# Patient Record
Sex: Male | Born: 1970 | Race: White | Hispanic: No | Marital: Single | Smoking: Former smoker
Health system: Southern US, Community
[De-identification: ages and names within clinical notes are randomized; demographics above are authoritative.]

---

## 1995-04-17 HISTORY — PX: HAND SURGERY: SHX662

## 1997-11-01 ENCOUNTER — Ambulatory Visit (HOSPITAL_COMMUNITY): Admission: RE | Admit: 1997-11-01 | Discharge: 1997-11-01 | Payer: Self-pay | Admitting: Internal Medicine

## 1997-12-08 ENCOUNTER — Ambulatory Visit (HOSPITAL_COMMUNITY): Admission: RE | Admit: 1997-12-08 | Discharge: 1997-12-08 | Payer: Self-pay | Admitting: Internal Medicine

## 2001-02-20 ENCOUNTER — Emergency Department (HOSPITAL_COMMUNITY): Admission: EM | Admit: 2001-02-20 | Discharge: 2001-02-20 | Payer: Self-pay | Admitting: Emergency Medicine

## 2001-02-20 ENCOUNTER — Encounter: Payer: Self-pay | Admitting: Emergency Medicine

## 2001-06-25 ENCOUNTER — Emergency Department (HOSPITAL_COMMUNITY): Admission: EM | Admit: 2001-06-25 | Discharge: 2001-06-25 | Payer: Self-pay | Admitting: Emergency Medicine

## 2001-06-25 ENCOUNTER — Encounter: Payer: Self-pay | Admitting: Emergency Medicine

## 2003-06-20 ENCOUNTER — Emergency Department (HOSPITAL_COMMUNITY): Admission: EM | Admit: 2003-06-20 | Discharge: 2003-06-20 | Payer: Self-pay | Admitting: Emergency Medicine

## 2016-01-25 ENCOUNTER — Encounter: Payer: Self-pay | Admitting: Diagnostic Neuroimaging

## 2016-01-25 ENCOUNTER — Ambulatory Visit (INDEPENDENT_AMBULATORY_CARE_PROVIDER_SITE_OTHER): Payer: Self-pay | Admitting: Diagnostic Neuroimaging

## 2016-01-25 VITALS — BP 139/90 | HR 100 | Ht 68.0 in | Wt 163.8 lb

## 2016-01-25 DIAGNOSIS — G4489 Other headache syndrome: Secondary | ICD-10-CM

## 2016-01-25 DIAGNOSIS — M542 Cervicalgia: Secondary | ICD-10-CM
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
## 2016-01-25 NOTE — Patient Instructions (Signed)
Thank you for coming to see [place] at [facility]. I hope we have been able to provide you high quality care today.  You may receive a patient satisfaction survey over the next few weeks. We would appreciate your feedback and comments so that we may continue to improve ourselves and the health of our patients.  - I will setup physical therapy  - Slightly reduce physical activities and exercise  - Use ice therapy   ~~~~~~~~~~~~~~~~~~~~~~~~~~~~~~~~~~~~~~~~~~~~~~~~~~~~~~~~~~~~~~~~~  DR. Amya Hlad'S GUIDE TO HAPPY AND HEALTHY LIVING These are some of my general health and wellness recommendations. Some of them may apply to you better than others. Please use common sense as you try these suggestions and feel free to ask me any questions.   ACTIVITY/FITNESS Mental, social, emotional and physical stimulation are very important for brain and body health. Try learning a new activity (arts, music, language, sports, games).  Keep moving your body to the best of your abilities. You can do this at home, inside or outside, the park, community center, gym or anywhere you like. Consider a physical therapist or personal trainer to get started. Consider the app Sworkit. Fitness trackers such as smart-watches, smart-phones or Fitbits can help as well.   NUTRITION Eat more plants: colorful vegetables, nuts, seeds and berries.  Eat less sugar, salt, preservatives and processed foods.  Avoid toxins such as cigarettes and alcohol.  Drink water when you are thirsty. Warm water with a slice of lemon is an excellent morning drink to start the day.  Consider these websites for more information The Nutrition Source (https://www.henry-hernandez.biz/) Precision Nutrition (WindowBlog.ch)   RELAXATION Consider practicing mindfulness meditation or other relaxation techniques such as deep breathing, prayer, yoga, tai chi, massage. See website mindful.org  or the apps Headspace or Calm to help get started.   SLEEP Try to get at least 7-8+ hours sleep per day. Regular exercise and reduced caffeine will help you sleep better. Practice good sleep hygeine techniques. See website sleep.org for more information.   PLANNING Prepare estate planning, living will, healthcare POA documents. Sometimes this is best planned with the help of an attorney. Theconversationproject.org and agingwithdignity.org are excellent resources.

## 2016-01-30 ENCOUNTER — Encounter: Payer: Self-pay | Admitting: Rehabilitation

## 2016-01-30 ENCOUNTER — Ambulatory Visit: Payer: Self-pay | Attending: Diagnostic Neuroimaging | Admitting: Rehabilitation

## 2016-01-30 DIAGNOSIS — M542 Cervicalgia: Secondary | ICD-10-CM | POA: Insufficient documentation

## 2016-01-30 DIAGNOSIS — R293 Abnormal posture: Secondary | ICD-10-CM | POA: Insufficient documentation
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
## 2016-01-30 NOTE — Patient Instructions (Signed)
    Lower Abdominal Exercises 2A  Lie on your back with your knees bent. Rest one foot on the table and hold your other leg with your arms at 90 degrees of hip flexion.  Pull your stomach muscles tight to prevent your low back from arching and march one foot.  Return that foot to the table, repete with the opposite foot.  Axial Extension (Chin Tuck)    Do this lying down.  Pull chin in and lengthen back of neck. Hold __5__ seconds while counting out loud. Repeat _10___ times. Do __2__ sessions per day.  http://gt2.exer.us/449   Copyright  VHI. All rights reserved.   Deep Squat    Squat and lift with both arms held against upper trunk. Tighten stomach muscles without holding breath. Use smooth movements to avoid jerking.  Copyright  VHI. All rights reserved.

## 2016-02-15 ENCOUNTER — Ambulatory Visit: Payer: Self-pay | Attending: Diagnostic Neuroimaging | Admitting: Physical Therapy

## 2016-02-15 ENCOUNTER — Encounter: Payer: Self-pay | Admitting: Physical Therapy

## 2016-02-15 DIAGNOSIS — R293 Abnormal posture: Secondary | ICD-10-CM | POA: Insufficient documentation

## 2016-02-15 DIAGNOSIS — M542 Cervicalgia: Secondary | ICD-10-CM | POA: Insufficient documentation

## 2016-02-16 NOTE — Therapy (Signed)
[facility] [address] [place], [place], [address] Phone: [phone]   Fax:  [phone]  Physical Therapy Treatment  Patient Details  Name: Brendan Holmes MRN: [id number] Date of Birth: 1970/09/06 Referring Provider: Andrey Spearman, MD  Encounter Date: 02/15/2016   02/15/16 1109  PT Visits / Re-Eval  Visit Number 2  Number of Visits 9  Date for PT Re-Evaluation 03/16/16 (date adjusted 4wks from 1st visit due to pt scheduling needs)  Authorization  Authorization Type self pay  PT Time Calculation  PT Start Time 1105  PT Stop Time 1145  PT Time Calculation (min) 40 min  PT - End of Session  Activity Tolerance Patient limited by pain;Patient tolerated treatment well  Behavior During Therapy [facility] for tasks assessed/performed     History reviewed. No pertinent past medical history.  Past Surgical History:  Procedure Laterality Date  . HAND SURGERY Right 1997   Pins placed    There were no vitals filed for this visit.    02/15/16 1106  Symptoms/Limitations  Subjective No new complaints. Describes it as more of a pressure now vs pain, with some dizzy spells. Has been doing chin tucks with no notable difference.   Limitations Walking;Standing  Patient Stated Goals "I want this thing on my neck to go away."    Pain Assessment  Currently in Pain? Yes  Pain Score 5  Pain Location Neck  Pain Orientation Right  Pain Descriptors / Indicators Sore;Pressure  Pain Type Chronic pain  Pain Onset More than a month ago  Pain Frequency Constant  Aggravating Factors  looking down. lying down flat  Pain Relieving Factors rest,          [facility] Adult PT Treatment/Exercise - 02/15/16 2329      Posture/Postural Control   Posture/Postural Control Postural limitations   Postural Limitations Rounded Shoulders;Forward head   Posture Comments Note pt with muscle tension in R paraspinous muscles (note he has muscle  tear), also has multiple trigger points in B upper traps.  PT evaluation very limited due to pt very tender to palpation therefore unable to fully assess cervical spine mobility/immobility and possible facet joint dysfunction.  Will assess as able.      Exercises   Other Exercises  supine: cervical nods and shakes x 10 reps each in pain free ranges only; seated edge of mat: chin tucks x 10 reps, posterior shoulder rolls x 10 reps.      Modalities   Modalities Ultrasound     Ultrasound   Ultrasound Location cervical paraspinal muscle/upper portion of upper trap on right side   Ultrasound Parameters 50% pulsed, 1.5 w/cm2, 1 mhz x 8 minutes   Ultrasound Goals Pain     Manual Therapy   Manual Therapy Soft tissue mobilization;Myofascial release;Manual Traction   Manual therapy comments for decreased pain and muscle tightness.    Soft tissue mobilization to right cervical paraspinals, upper traps, scalenes and STM in both seated and supine positions.   Myofascial Release along right cervical paraspinals, STM, scalenes and upper trap. performed both in propped seated postition and in supine   Manual Traction in hook lying position: suboccipital release and manual cervcial distraction performed. passive stretching to upper trap, scalenes and STM concurrent with gentle cervical distraction.  PT Short Term Goals - 01/30/16 1650      PT SHORT TERM GOAL #1   Title =LTG's           PT Long Term Goals - 01/30/16 1911      PT LONG TERM GOAL #1   Title Pt will be independent with cervical and postural HEP in order to indicate improved functional mobility and decreased pain.  (Target Date: 03/16/16)   Time 4   Period Weeks   Status New     PT LONG TERM GOAL #2   Title Pt will report no more than 3/10 pain with all functional mobility in order to indicate pain is decreased limiting factor.     Time 4   Period Weeks   Status New     PT LONG TERM  GOAL #3   Title Will assess NDI and improve by 20% in order to indicate improved self reported pain with functional mobility.     Time 4   Period Weeks   Status New     PT LONG TERM GOAL #4   Title Pt will improve cervical extension to 30 deg in order to indicate improved ability to perform work duties.     Time 4   Period Weeks   Status New     PT LONG TERM GOAL #5   Title Pt will improve cervical flexion to 40 deg in order to indicate improved ability to perform work duties.     Time 4   Period Weeks   Status New        02/15/16 1110  Plan  Clinical Impression Statement Today's skilled session focused on pain reduction through modalities and manual therapy. Pt did report a slight decrease in pressure/pain after session. Pt also completed the NDI with score of 50% impaired. Increased tolerance to manual therapy/touch today vs on eval. Pt is making progress toward goals and should benefit from continued PT to progress toward unmet goals.  Pt will benefit from skilled therapeutic intervention in order to improve on the following deficits Decreased mobility;Decreased range of motion;Decreased strength;Increased muscle spasms;Improper body mechanics;Postural dysfunction;Pain  Rehab Potential Good  Clinical Impairments Affecting Rehab Potential unsure of grade of muscle tear  PT Frequency 2x / week  PT Duration 4 weeks (note date adjusted from 1st scheduled visit due to scheduling conflict)  PT Treatment/Interventions ADLs/Self Care Home Management;Electrical Stimulation;Moist Heat;Ultrasound;Neuromuscular re-education;Patient/family education;Manual techniques;Taping  PT Next Visit Plan continue to address pain/pressure via modalities as needed (check if ultrasound helped), manual therapy for decreased muscle tightness/increased cervical motions  Consulted and Agree with Plan of Care Patient        Patient will benefit from skilled therapeutic intervention in order to improve the  following deficits and impairments:  Decreased mobility, Decreased range of motion, Decreased strength, Increased muscle spasms, Improper body mechanics, Postural dysfunction, Pain  Visit Diagnosis: Cervicalgia  Abnormal posture     Problem List There are no active problems to display for this patient.   Willow Ora, PTA, [facility] [address], [place], [place] [address] [phone] 02/16/16, 11:49 PM   Name: Brendan Holmes MRN: [id number] Date of Birth: 1971/03/06

## 2016-02-17 ENCOUNTER — Ambulatory Visit: Payer: Self-pay | Admitting: Physical Therapy

## 2016-02-17 ENCOUNTER — Encounter: Payer: Self-pay | Admitting: Physical Therapy

## 2016-02-17 DIAGNOSIS — M542 Cervicalgia: Secondary | ICD-10-CM

## 2016-02-17 DIAGNOSIS — R293 Abnormal posture: Secondary | ICD-10-CM

## 2016-02-17 NOTE — Therapy (Signed)
[facility] [address] [place], [place], [address] Phone: [phone]   Fax:  [phone]  Physical Therapy Treatment  Patient Details  Name: Brendan Holmes MRN: [id number] Date of Birth: 1970-12-02 Referring Provider: Andrey Spearman, MD  Encounter Date: 02/17/2016      PT End of Session - 02/17/16 1152    Visit Number 3   Number of Visits 9   Date for PT Re-Evaluation 03/16/16  date adjusted 4wks from 1st visit due to pt scheduling needs   Authorization Type self pay   PT Start Time 1102   PT Stop Time 1145   PT Time Calculation (min) 43 min   Activity Tolerance Patient limited by pain;Patient tolerated treatment well   Behavior During Therapy [facility] for tasks assessed/performed      History reviewed. No pertinent past medical history.  Past Surgical History:  Procedure Laterality Date  . HAND SURGERY Right 1997   Pins placed    There were no vitals filed for this visit.      Subjective Assessment - 02/17/16 1110    Subjective Felt better after last session. Symptoms returned that night. Does report that some movements are getting easier like washing hair and looking around.    Limitations Walking;Standing   Patient Stated Goals "I want this thing on my neck to go away."     Currently in Pain? Yes   Pain Score 5    Pain Location Neck   Pain Orientation Right   Pain Descriptors / Indicators Sore;Pressure   Pain Type Acute pain   Pain Radiating Towards upwards into the head   Pain Onset More than a month ago   Pain Frequency Constant   Aggravating Factors  looking down, lying flat, raising arms   Pain Relieving Factors rest             [facility] Adult PT Treatment/Exercise - 02/17/16 1114      Exercises   Other Exercises  cervical isometrics for extension and bil lateral flexion, 5 sec holds x 10 reps each with cues on ex form and resistance against PTA hand     Modalities   Modalities  Ultrasound     Ultrasound   Ultrasound Location cervical paraspinal muscle/upper portion of upper trap on right side   Ultrasound Parameters 100%, 1 mhz, 1.0 w/cm2 x 10 minutes   Ultrasound Goals Pain     Manual Therapy   Manual Therapy Soft tissue mobilization;Myofascial release;Manual Traction   Manual therapy comments for decreased pain and muscle tightness.    Soft tissue mobilization to right cervical paraspinals, upper traps, scalenes and STM in both seated and supine positions.   Myofascial Release along right cervical paraspinals, STM, scalenes and upper trap. performed both in propped seated postition and in supine   Manual Traction in hook lying position: suboccipital release and manual cervcial distraction performed. passive stretching to upper trap, scalenes and STM concurrent with gentle cervical distraction.                                          PT Short Term Goals - 01/30/16 1650      PT SHORT TERM GOAL #1   Title =LTG's           PT Long Term Goals - 01/30/16 1911      PT LONG TERM GOAL #1  Title Pt will be independent with cervical and postural HEP in order to indicate improved functional mobility and decreased pain.  (Target Date: 03/16/16)   Time 4   Period Weeks   Status New     PT LONG TERM GOAL #2   Title Pt will report no more than 3/10 pain with all functional mobility in order to indicate pain is decreased limiting factor.     Time 4   Period Weeks   Status New     PT LONG TERM GOAL #3   Title Will assess NDI and improve by 20% in order to indicate improved self reported pain with functional mobility.     Time 4   Period Weeks   Status New     PT LONG TERM GOAL #4   Title Pt will improve cervical extension to 30 deg in order to indicate improved ability to perform work duties.     Time 4   Period Weeks   Status New     PT LONG TERM GOAL #5   Title Pt will improve cervical flexion to 40 deg in order to indicate improved ability to  perform work duties.     Time 4   Period Weeks   Status New            Plan - 02/17/16 1152    Clinical Impression Statement Today's skilled session continued to focus on decreased pain and increased cervical motions. Pt reported feeling decreased symptoms at end of session (decreased pain/pressure), including in facial muscles. Pt is making steady progress toward goals and should benefit from continued PT to progress toward unmet goals.                                                Rehab Potential Good   Clinical Impairments Affecting Rehab Potential unsure of grade of muscle tear   PT Frequency 2x / week   PT Duration 4 weeks  note date adjusted from 1st scheduled visit due to scheduling conflict   PT Treatment/Interventions ADLs/Self Care Home Management;Electrical Stimulation;Moist Heat;Ultrasound;Neuromuscular re-education;Patient/family education;Manual techniques;Taping   PT Next Visit Plan continue to address pain/pressure via modalities as needed, manual therapy for decreased muscle tightness/increased cervical motions   Consulted and Agree with Plan of Care Patient      Patient will benefit from skilled therapeutic intervention in order to improve the following deficits and impairments:  Decreased mobility, Decreased range of motion, Decreased strength, Increased muscle spasms, Improper body mechanics, Postural dysfunction, Pain  Visit Diagnosis: Cervicalgia  Abnormal posture     Problem List There are no active problems to display for this patient.   Willow Ora, PTA, [facility] [address] [place], [place] [address] [phone] 02/17/16, 11:55 AM   Name: Brendan Holmes MRN: [id number] Date of Birth: October 08, 1970

## 2016-02-20 ENCOUNTER — Ambulatory Visit: Payer: Self-pay | Admitting: Physical Therapy

## 2016-02-20 ENCOUNTER — Encounter: Payer: Self-pay | Admitting: Physical Therapy

## 2016-02-20 DIAGNOSIS — R293 Abnormal posture: Secondary | ICD-10-CM

## 2016-02-20 DIAGNOSIS — M542 Cervicalgia: Secondary | ICD-10-CM

## 2016-02-20 NOTE — Patient Instructions (Addendum)
Extension: Isometric, Ball   Stand with hands locked behind your head.  Tuck chin, pushing head into hands and hold 5 seconds. Repeat __10 times per set. Do _1_ sets per session. Do _1-2 sessions per week.  Copyright  VHI. All rights reserved.

## 2016-02-20 NOTE — Therapy (Signed)
[facility] [address] [place], [place], [address] Phone: [phone]   Fax:  [phone]  Physical Therapy Treatment  Patient Details  Name: Brendan Holmes MRN: [id number] Date of Birth: 1970/04/23 Referring Provider: Andrey Spearman, MD  Encounter Date: 02/20/2016      PT End of Session - 02/20/16 1520    Visit Number 4   Number of Visits 9   Date for PT Re-Evaluation 03/16/16  date adjusted 4wks from 1st visit due to pt scheduling needs   Authorization Type self pay   PT Start Time 1107   PT Stop Time 1145   PT Time Calculation (min) 38 min   Activity Tolerance Patient limited by pain;Patient tolerated treatment well   Behavior During Therapy [facility] for tasks assessed/performed      History reviewed. No pertinent past medical history.  Past Surgical History:  Procedure Laterality Date  . HAND SURGERY Right 1997   Pins placed    There were no vitals filed for this visit.      Subjective Assessment - 02/20/16 1107    Subjective No new complaints. Does feel like he is getting a little better with pain, still with the pressure that radiates up into his jaw/head with certain movements and palpation to sore area.   Limitations Walking;Standing   Patient Stated Goals "I want this thing on my neck to go away."     Currently in Pain? Yes   Pain Score 5    Pain Location Neck   Pain Orientation Right   Pain Descriptors / Indicators Aching;Pressure   Pain Type Acute pain   Pain Onset More than a month ago   Pain Frequency Constant   Aggravating Factors  looking down, pressure to sore area and raising arms   Pain Relieving Factors rest, heat             [facility] Adult PT Treatment/Exercise - 02/20/16 1515      Neck Exercises: Machines for Strengthening   UBE (Upper Arm Bike) level 2.5 x 2 minutes each fwd/bwd with cues on posture and neutral cervcial spine     Neck Exercises: Standing   Wall Push  Ups 10 reps;Limitations   Wall Push Ups Limitations with red pball on wall: cues on form and technique   Upper Extremity Flexion with Stabilization Flexion;10 reps;Limitations   UE Flexion with Stabilization Limitations rolling red pball up wall with flexion stretch at top, cues on neutral cervical position     Neck Exercises: Seated   Cervical Isometrics Extension;Right lateral flexion;Left lateral flexion;5 secs;10 reps;Limitations   Cervical Isometrics Limitations cues for correct form and technique with each one. added extension to HEP     Ultrasound   Ultrasound Location cervical paraspinal muscle/upper portion of upper trap on right side   Ultrasound Parameters 100%, 1 mhz, 1.0 w/cm2, x 10 minutes   Ultrasound Goals Pain     Manual Therapy   Manual Therapy Soft tissue mobilization   Manual therapy comments for decreased pain and muscle tightness.    Soft tissue mobilization to right cervical paraspinals, upper traps, scalenes and STM in both seated and supine positions.            PT Education - 02/20/16 1143    Education provided Yes   Education Details added seated cervical extension isometrics to HEP   Person(s) Educated Patient   Methods Explanation;Demonstration;Verbal cues;Handout   Comprehension Verbalized understanding;Returned demonstration;Need further instruction  PT Short Term Goals - 01/30/16 1650      PT SHORT TERM GOAL #1   Title =LTG's           PT Long Term Goals - 01/30/16 1911      PT LONG TERM GOAL #1   Title Pt will be independent with cervical and postural HEP in order to indicate improved functional mobility and decreased pain.  (Target Date: 03/16/16)   Time 4   Period Weeks   Status New     PT LONG TERM GOAL #2   Title Pt will report no more than 3/10 pain with all functional mobility in order to indicate pain is decreased limiting factor.     Time 4   Period Weeks   Status New     PT LONG TERM GOAL #3   Title Will  assess NDI and improve by 20% in order to indicate improved self reported pain with functional mobility.     Time 4   Period Weeks   Status New     PT LONG TERM GOAL #4   Title Pt will improve cervical extension to 30 deg in order to indicate improved ability to perform work duties.     Time 4   Period Weeks   Status New     PT LONG TERM GOAL #5   Title Pt will improve cervical flexion to 40 deg in order to indicate improved ability to perform work duties.     Time 4   Period Weeks   Status New             Plan - 02/20/16 1520    Clinical Impression Statement today's skilled session continued to address decreasing pain. Also advanced strengthening exercises today without any issues reported. Pt is making slow, steady progress toward goals and should benefit from continued PT to progress toward unmet goals.    Rehab Potential Good   Clinical Impairments Affecting Rehab Potential unsure of grade of muscle tear   PT Frequency 2x / week   PT Duration 4 weeks  note date adjusted from 1st scheduled visit due to scheduling conflict   PT Treatment/Interventions ADLs/Self Care Home Management;Electrical Stimulation;Moist Heat;Ultrasound;Neuromuscular re-education;Patient/family education;Manual techniques;Taping   PT Next Visit Plan continue to address pain/pressure via modalitie and manual therapy as needed, continue to work on cervical strengthening.    Consulted and Agree with Plan of Care Patient      Patient will benefit from skilled therapeutic intervention in order to improve the following deficits and impairments:  Decreased mobility, Decreased range of motion, Decreased strength, Increased muscle spasms, Improper body mechanics, Postural dysfunction, Pain  Visit Diagnosis: Cervicalgia  Abnormal posture     Problem List There are no active problems to display for this patient.   Willow Ora, PTA, [facility] [address] [place], [place] [address] [phone] 02/20/16, 3:25 PM   Name: Brendan Holmes MRN: [id number] Date of Birth: 1970/12/07

## 2016-02-24 ENCOUNTER — Encounter: Payer: Self-pay | Admitting: Rehabilitation

## 2016-02-24 ENCOUNTER — Ambulatory Visit: Payer: Self-pay | Admitting: Rehabilitation

## 2016-02-24 DIAGNOSIS — R293 Abnormal posture: Secondary | ICD-10-CM

## 2016-02-24 DIAGNOSIS — M542 Cervicalgia: Secondary | ICD-10-CM

## 2016-02-24 NOTE — Therapy (Signed)
[facility] [address] [place], [place], [address] Phone: [phone]   Fax:  [phone]  Physical Therapy Treatment  Patient Details  Name: Brendan Holmes MRN: [id number] Date of Birth: July 07, 1970 Referring Provider: Andrey Spearman, MD  Encounter Date: 02/24/2016      PT End of Session - 02/24/16 1950    Visit Number 5   Number of Visits 9   Date for PT Re-Evaluation 03/16/16  date adjusted 4wks from 1st visit due to pt scheduling needs   Authorization Type self pay   PT Start Time 1148   PT Stop Time 1232   PT Time Calculation (min) 44 min   Activity Tolerance Patient limited by pain;Patient tolerated treatment well   Behavior During Therapy [facility] for tasks assessed/performed      History reviewed. No pertinent past medical history.  Past Surgical History:  Procedure Laterality Date  . HAND SURGERY Right 1997   Pins placed    There were no vitals filed for this visit.      Subjective Assessment - 02/24/16 1155    Currently in Pain? Yes   Pain Score 5    Pain Location Neck   Pain Orientation Right   Pain Descriptors / Indicators Pressure   Pain Type Acute pain   Pain Onset More than a month ago   Pain Frequency Constant                         [facility] Adult PT Treatment/Exercise - 02/24/16 0001      Self-Care   Self-Care Other Self-Care Comments   Other Self-Care Comments  Continue to provide demonstration and education on importance of posture throughout all mobility.  Also discussed muscle guarding as this is marked reason pt presents with increased tightness on B portions of cervical region.       Ultrasound   Ultrasound Location cervical paraspinals/upper trap on R side   Ultrasound Parameters 100%, 44mHz, 1.5w/cm2 x 10 mins.  Light pressure with ultrasound head to upper trap for soft tissue mobilization.     Ultrasound Goals Pain     Manual Therapy   Manual Therapy  Soft tissue mobilization   Manual therapy comments for decreased pain and muscle tightness.    Soft tissue mobilization to right cervical paraspinals, upper traps, and scalenes in supine position    Myofascial Release To R upper trap, R scalenes, L upper trap in seated and supine   Manual Traction Light manual traction as well as suboccipital release during session x 3 reps of 2 mins.  Provided education/demonstration on how to use tennis balls in sleeve to perform self sub occipital release.  Pt returned demonstration and states he would attempt at home for pain relief and decreased referred pain.                 PT Education - 02/24/16 1949    Education provided Yes   Education Details importance of posture during all mobility. Verbal addition of UE extension with band in doorway.     Person(s) Educated Patient   Methods Explanation;Demonstration   Comprehension Verbalized understanding;Returned demonstration          PT Short Term Goals - 01/30/16 1650      PT SHORT TERM GOAL #1   Title =LTG's           PT Long Term Goals - 01/30/16 1911      PT LONG TERM  GOAL #1   Title Pt will be independent with cervical and postural HEP in order to indicate improved functional mobility and decreased pain.  (Target Date: 03/16/16)   Time 4   Period Weeks   Status New     PT LONG TERM GOAL #2   Title Pt will report no more than 3/10 pain with all functional mobility in order to indicate pain is decreased limiting factor.     Time 4   Period Weeks   Status New     PT LONG TERM GOAL #3   Title Will assess NDI and improve by 20% in order to indicate improved self reported pain with functional mobility.     Time 4   Period Weeks   Status New     PT LONG TERM GOAL #4   Title Pt will improve cervical extension to 30 deg in order to indicate improved ability to perform work duties.     Time 4   Period Weeks   Status New     PT LONG TERM GOAL #5   Title Pt will improve  cervical flexion to 40 deg in order to indicate improved ability to perform work duties.     Time 4   Period Weeks   Status New               Plan - 02/24/16 1950    Clinical Impression Statement Skilled session continues to focus on decreasing cervical pain and improving flexibility in cervical region.  Pt continues to make slow progress towards goals.      Rehab Potential Good   Clinical Impairments Affecting Rehab Potential unsure of grade of muscle tear   PT Frequency 2x / week   PT Duration 4 weeks  note date adjusted from 1st scheduled visit due to scheduling conflict   PT Treatment/Interventions ADLs/Self Care Home Management;Electrical Stimulation;Moist Heat;Ultrasound;Neuromuscular re-education;Patient/family education;Manual techniques;Taping   PT Next Visit Plan continue to address pain/pressure via modalitie and manual therapy as needed, continue to work on cervical strengthening.    Consulted and Agree with Plan of Care Patient      Patient will benefit from skilled therapeutic intervention in order to improve the following deficits and impairments:  Decreased mobility, Decreased range of motion, Decreased strength, Increased muscle spasms, Improper body mechanics, Postural dysfunction, Pain  Visit Diagnosis: Cervicalgia  Abnormal posture     Problem List There are no active problems to display for this patient.   Cameron Sprang, PT, MPT [facility] [address] [place], [place], [address] Phone: [phone]   Fax:  [phone] 02/24/16, 7:52 PM  Name: Brendan Holmes MRN: [id number] Date of Birth: Oct 17, 1970

## 2016-02-27 ENCOUNTER — Ambulatory Visit: Payer: Self-pay | Admitting: Physical Therapy

## 2016-02-27 ENCOUNTER — Encounter: Payer: Self-pay | Admitting: Physical Therapy

## 2016-02-27 DIAGNOSIS — M542 Cervicalgia: Secondary | ICD-10-CM

## 2016-02-27 DIAGNOSIS — R293 Abnormal posture: Secondary | ICD-10-CM

## 2016-02-27 NOTE — Therapy (Cosign Needed)
[facility] [address] [place], [place], [address] Phone: [phone]   Fax:  [phone]  Physical Therapy Treatment  Patient Details  Name: Brendan Holmes MRN: [id number] Date of Birth: [birth year] Referring Provider: Andrey Spearman, MD  Encounter Date: 02/27/2016      PT End of Session - 02/27/16 1454    Visit Number 6   Number of Visits 9   Date for PT Re-Evaluation 03/16/16  date adjusted 4wks from 1st visit due to pt scheduling needs   Authorization Type self pay   PT Start Time 1230   PT Stop Time 1315   PT Time Calculation (min) 45 min   Activity Tolerance Patient limited by pain;Patient tolerated treatment well   Behavior During Therapy [facility] for tasks assessed/performed      History reviewed. No pertinent past medical history.  Past Surgical History:  Procedure Laterality Date  . HAND SURGERY Right 1997   Pins placed    There were no vitals filed for this visit.      Subjective Assessment - 02/27/16 1232    Subjective Patient reports that he feels some improvement with his pain this weekend but is still unable to sleep with a pillow at night due to the pain and pressure. He lays down with his head to the left and no pillow.   Limitations Walking;Standing   Patient Stated Goals "I want this thing on my neck to go away."     Pain Score 5    Pain Location Neck   Pain Orientation Right   Pain Descriptors / Indicators Pressure   Pain Type Acute pain   Pain Onset More than a month ago   Pain Frequency Constant   Aggravating Factors  Looking down, pressure to sore area and raising arms   Pain Relieving Factors rest, heat           [facility] Adult PT Treatment/Exercise - 02/27/16 1437      Neck Exercises: Theraband   Scapula Retraction 10 reps;Red  2x reps elblows flexed   Scapula Retraction Limitations Red theraband and handles attached to door handle. Verbal and tactile cues for correct  technique.   Rows 10 reps;Red;Limitations  x2 reps elbows extended.   Rows Limitations Red theraband anchored in top corner of door; scapular retraction and depression. Verbal and tactile cues for proper technique.     Neck Exercises: Standing   Wall Push Ups 10 reps;Limitations   Wall Push Ups Limitations with red pball on wall: cues on form and technique;     Ultrasound   Ultrasound Location cervical paraspinals/upper trap on R side   Ultrasound Parameters 100%, 29mHz, 1w/cm2 x 10 mins. Light pressure with ultrasound head to upper trap for soft tissue mobilization.    Ultrasound Goals Pain     Manual Therapy   Manual Therapy Soft tissue mobilization   Manual therapy comments for decreased pain and muscle tightness.    Soft tissue mobilization to right cervical paraspinals, upper traps, and scalenes in supine position    Manual Traction Light manual traction 3 reps with 40 second holds; as well as suboccipital release during session x 3 reps of 1 min.           PT Short Term Goals - 01/30/16 1650      PT SHORT TERM GOAL #1   Title =LTG's           PT Long Term Goals - 01/30/16 1911  PT LONG TERM GOAL #1   Title Pt will be independent with cervical and postural HEP in order to indicate improved functional mobility and decreased pain.  (Target Date: 03/16/16)   Time 4   Period Weeks   Status New     PT LONG TERM GOAL #2   Title Pt will report no more than 3/10 pain with all functional mobility in order to indicate pain is decreased limiting factor.     Time 4   Period Weeks   Status New     PT LONG TERM GOAL #3   Title Will assess NDI and improve by 20% in order to indicate improved self reported pain with functional mobility.     Time 4   Period Weeks   Status New     PT LONG TERM GOAL #4   Title Pt will improve cervical extension to 30 deg in order to indicate improved ability to perform work duties.     Time 4   Period Weeks   Status New     PT LONG  TERM GOAL #5   Title Pt will improve cervical flexion to 40 deg in order to indicate improved ability to perform work duties.     Time 4   Period Weeks   Status New           Plan - 02/27/16 1454    Clinical Impression Statement Todays skilled session focused on reducing cervical pain and strengthening postural muscles to relieve stress on the cervical region. Scapular retraction with elbows bent seemed to be the most effective activity at activating scapular stabilizers. Pt is progressing slowly towards goals and should benefit from continued PT to achieve goals.   Rehab Potential Good   Clinical Impairments Affecting Rehab Potential unsure of grade of muscle tear   PT Frequency 2x / week   PT Duration 4 weeks  note date adjusted from 1st scheduled visit due to scheduling conflict   PT Treatment/Interventions ADLs/Self Care Home Management;Electrical Stimulation;Moist Heat;Ultrasound;Neuromuscular re-education;Patient/family education;Manual techniques;Taping   PT Next Visit Plan continue to address pain/pressure via modalities and manual therapy as needed, continue to work on cervical strengthening.    Consulted and Agree with Plan of Care Patient      Patient will benefit from skilled therapeutic intervention in order to improve the following deficits and impairments:  Decreased mobility, Decreased range of motion, Decreased strength, Increased muscle spasms, Improper body mechanics, Postural dysfunction, Pain  Visit Diagnosis: Cervicalgia  Abnormal posture     Problem List There are no active problems to display for this patient.   Benjiman Core, SPTA 02/27/2016, 3:56 PM  [facility] [address] [place], [place], [address] Phone: [phone]   Fax:  [phone]  Name: Brendan Holmes MRN: [id number] Date of Birth: 1970/08/27

## 2016-02-29 ENCOUNTER — Ambulatory Visit: Payer: Self-pay | Admitting: Physical Therapy

## 2016-03-05 ENCOUNTER — Ambulatory Visit: Payer: Self-pay | Admitting: Physical Therapy

## 2016-03-05 ENCOUNTER — Encounter: Payer: Self-pay | Admitting: Physical Therapy

## 2016-03-05 DIAGNOSIS — M542 Cervicalgia: Secondary | ICD-10-CM

## 2016-03-05 DIAGNOSIS — R293 Abnormal posture: Secondary | ICD-10-CM

## 2016-03-05 NOTE — Therapy (Signed)
[facility] [address] [place], [place], [address] Phone: [phone]   Fax:  [phone]  Physical Therapy Treatment  Patient Details  Name: Brendan Holmes MRN: [id number] Date of Birth: 1970/07/07 Referring Provider: Andrey Spearman, MD  Encounter Date: 03/05/2016      PT End of Session - 03/05/16 1324    Visit Number 7   Number of Visits 9   Date for PT Re-Evaluation 03/16/16  date adjusted 4wks from 1st visit due to pt scheduling needs   Authorization Type self pay   PT Start Time 1318   PT Stop Time 1359   PT Time Calculation (min) 41 min   Activity Tolerance Patient tolerated treatment well   Behavior During Therapy [facility] for tasks assessed/performed      History reviewed. No pertinent past medical history.  Past Surgical History:  Procedure Laterality Date  . HAND SURGERY Right 1997   Pins placed    There were no vitals filed for this visit.      Subjective Assessment - 03/05/16 1322    Subjective No new complaints. Feels the knot is getting smaller, still has pain when he looks down or in certain directions.    Limitations Walking;Standing   Patient Stated Goals "I want this thing on my neck to go away."     Currently in Pain? Yes   Pain Score 4    Pain Location Neck   Pain Orientation Right   Pain Descriptors / Indicators Pressure   Pain Type Acute pain   Pain Radiating Towards upwards into the head   Pain Onset More than a month ago   Pain Frequency Constant   Aggravating Factors  looking down, pressure to knot in neck, raising arms up   Pain Relieving Factors rest, heat            [facility] Adult PT Treatment/Exercise - 03/05/16 1326      Neck Exercises: Machines for Strengthening   UBE (Upper Arm Bike) level 2.5 x 2 minutes each fwd/bwd with cues on posture and neutral cervcial spine     Neck Exercises: Theraband   Scapula Retraction 10 reps;Green;Limitations   Scapula  Retraction Limitations Green theraband and handles attached to door handle. Verbal and tactile cues for correct technique.   Rows 10 reps;Green   Rows Limitations Green theraband anchored in top corner of door; scapular retraction and depression. Verbal and tactile cues for proper technique.   Other Theraband Exercises shoulder abduction with green band x 10 reps each side, cues on form and technique     Neck Exercises: Standing   Wall Push Ups 10 reps;Limitations   Wall Push Ups Limitations with red pball on wall: cues on form and technique;   Upper Extremity Flexion with Stabilization Flexion;10 reps;Limitations   UE Flexion with Stabilization Limitations rolling red pball up wall with flexion stretch at top, cues on neutral cervical position     Ultrasound   Ultrasound Location cervical paraspinal/upper trap on right side   Ultrasound Parameters 100 %, 1 mhz, 1.2 w/cm2 x 8 minutes for decreased pain/increased tissue extensibility   Ultrasound Goals Pain     Manual Therapy   Manual Therapy Soft tissue mobilization;Myofascial release             PT Short Term Goals - 01/30/16 1650      PT SHORT TERM GOAL #1   Title =LTG's           PT Long Term  Goals - 01/30/16 1911      PT LONG TERM GOAL #1   Title Pt will be independent with cervical and postural HEP in order to indicate improved functional mobility and decreased pain.  (Target Date: 03/16/16)   Time 4   Period Weeks   Status New     PT LONG TERM GOAL #2   Title Pt will report no more than 3/10 pain with all functional mobility in order to indicate pain is decreased limiting factor.     Time 4   Period Weeks   Status New     PT LONG TERM GOAL #3   Title Will assess NDI and improve by 20% in order to indicate improved self reported pain with functional mobility.     Time 4   Period Weeks   Status New     PT LONG TERM GOAL #4   Title Pt will improve cervical extension to 30 deg in order to indicate improved  ability to perform work duties.     Time 4   Period Weeks   Status New     PT LONG TERM GOAL #5   Title Pt will improve cervical flexion to 40 deg in order to indicate improved ability to perform work duties.     Time 4   Period Weeks   Status New            Plan - 03/05/16 1325    Clinical Impression Statement today's skilled session continued to focus on reducing cervical pain and increasing cervical/postural strength. No issues reported with session. Pt is making steady progress toward goals and should benefit from continued PT to progress toward unmet goals.    Rehab Potential Good   Clinical Impairments Affecting Rehab Potential unsure of grade of muscle tear   PT Frequency 2x / week   PT Duration 4 weeks  note date adjusted from 1st scheduled visit due to scheduling conflict   PT Treatment/Interventions ADLs/Self Care Home Management;Electrical Stimulation;Moist Heat;Ultrasound;Neuromuscular re-education;Patient/family education;Manual techniques;Taping   PT Next Visit Plan continue to address pain/pressure via modalities and manual therapy as needed, continue to work on cervical strengthening.    Consulted and Agree with Plan of Care Patient      Patient will benefit from skilled therapeutic intervention in order to improve the following deficits and impairments:  Decreased mobility, Decreased range of motion, Decreased strength, Increased muscle spasms, Improper body mechanics, Postural dysfunction, Pain  Visit Diagnosis: Cervicalgia  Abnormal posture     Problem List There are no active problems to display for this patient.   Willow Ora, PTA, [facility] [address] [place], [place] [address] [phone] 03/05/16, 2:01 PM   Name: Brendan Holmes MRN: [id number] Date of Birth: Aug 26, 1970

## 2016-03-07 ENCOUNTER — Ambulatory Visit: Payer: Self-pay | Admitting: Physical Therapy

## 2016-03-07 ENCOUNTER — Encounter: Payer: Self-pay | Admitting: Physical Therapy

## 2016-03-07 DIAGNOSIS — M542 Cervicalgia: Secondary | ICD-10-CM

## 2016-03-07 NOTE — Therapy (Signed)
[facility] [address] [place], [place], [address] Phone: [phone]   Fax:  [phone]  Physical Therapy Treatment  Patient Details  Name: Brendan Holmes MRN: [id number] Date of Birth: 1970-06-19 Referring Provider: Andrey Spearman, MD  Encounter Date: 03/07/2016      PT End of Session - 03/07/16 1150    Visit Number 8   Number of Visits 9   Date for PT Re-Evaluation 03/16/16  date adjusted 4wks from 1st visit due to pt scheduling needs   Authorization Type self pay   PT Start Time 1100   PT Stop Time 1145   PT Time Calculation (min) 45 min   Activity Tolerance Patient tolerated treatment well   Behavior During Therapy [facility] for tasks assessed/performed      History reviewed. No pertinent past medical history.  Past Surgical History:  Procedure Laterality Date  . HAND SURGERY Right 1997   Pins placed    There were no vitals filed for this visit.      Subjective Assessment - 03/07/16 1104    Subjective Doesn't feel like its getting better with exertion- pushing grocery shopping cart, gets a vertigo type feeling with bumping sensation.   Limitations Walking;Standing   Patient Stated Goals "I want this thing on my neck to go away."     Pain Onset More than a month ago                         [facility] Adult PT Treatment/Exercise - 03/07/16 0001      Neck Exercises: Machines for Strengthening   UBE (Upper Arm Bike) level 2.5 x 2 minutes each fwd/bwd with cues on posture and neutral cervcial spine   Cybex Row 10x2 cues for posture/ scapula retraction, 25#   Power Tower standing "Chop" motion 15# each side, emphasizing lat activiation and scapula stability                             Ultrasound   Ultrasound Location cervical paraspinal/upper trap on right side.   Ultrasound Parameters 100%, 1 mhz, 1.2 w/cm x 8 min for decreased pain/ tissue extensibility   Ultrasound Goals Pain     Manual Therapy   Manual Therapy Soft tissue mobilization;Myofascial release   Manual therapy comments for decreased pain and muscle tightness.    Soft tissue mobilization to right cervical paraspinals, upper traps, and scalenes in supine position  Reported increased discomfort with pressure directly on "muscle knot"                PT Education - 03/07/16 1149    Education provided Yes   Education Details Discussed scheduling for last visit for LTG check.   Person(s) Educated Patient   Methods Explanation;Demonstration   Comprehension Verbalized understanding;Returned demonstration          PT Short Term Goals - 01/30/16 1650      PT SHORT TERM GOAL #1   Title =LTG's           PT Long Term Goals - 01/30/16 1911      PT LONG TERM GOAL #1   Title Pt will be independent with cervical and postural HEP in order to indicate improved functional mobility and decreased pain.  (Target Date: 03/16/16)   Time 4   Period Weeks   Status New     PT LONG TERM GOAL #2   Title  Pt will report no more than 3/10 pain with all functional mobility in order to indicate pain is decreased limiting factor.     Time 4   Period Weeks   Status New     PT LONG TERM GOAL #3   Title Will assess NDI and improve by 20% in order to indicate improved self reported pain with functional mobility.     Time 4   Period Weeks   Status New     PT LONG TERM GOAL #4   Title Pt will improve cervical extension to 30 deg in order to indicate improved ability to perform work duties.     Time 4   Period Weeks   Status New     PT LONG TERM GOAL #5   Title Pt will improve cervical flexion to 40 deg in order to indicate improved ability to perform work duties.     Time 4   Period Weeks   Status New               Plan - 03/07/16 1151    Clinical Impression Statement Pt reports continued discomort/pressure/pain during daily activities that there is a "bumping sensation" like using the grocery  cart or going on some of his work equipment. Also has continued neck pain with exertion or sitting on certain chairs.  Pt would like to continue PT to treat these issues.             Rehab Potential Good   Clinical Impairments Affecting Rehab Potential unsure of grade of muscle tear   PT Frequency 2x / week   PT Duration 4 weeks  note date adjusted from 1st scheduled visit due to scheduling conflict   PT Treatment/Interventions ADLs/Self Care Home Management;Electrical Stimulation;Moist Heat;Ultrasound;Neuromuscular re-education;Patient/family education;Manual techniques;Taping   PT Next Visit Plan Check goals, renew?, refer to orthopedic PT?   Consulted and Agree with Plan of Care Patient      Patient will benefit from skilled therapeutic intervention in order to improve the following deficits and impairments:  Decreased mobility, Decreased range of motion, Decreased strength, Increased muscle spasms, Improper body mechanics, Postural dysfunction, Pain  Visit Diagnosis: Cervicalgia     Problem List There are no active problems to display for this patient.   Bjorn Loser 03/07/2016, 12:02 PM  [facility] [address] [place], [place], [address] Phone: [phone]   Fax:  [phone]  Name: Brendan Holmes MRN: [id number] Date of Birth: [birth year]

## 2016-03-16 ENCOUNTER — Ambulatory Visit: Payer: Self-pay | Attending: Diagnostic Neuroimaging | Admitting: Rehabilitation

## 2016-03-16 ENCOUNTER — Encounter: Payer: Self-pay | Admitting: Rehabilitation

## 2016-03-16 DIAGNOSIS — M542 Cervicalgia: Secondary | ICD-10-CM | POA: Insufficient documentation

## 2016-03-16 DIAGNOSIS — R293 Abnormal posture: Secondary | ICD-10-CM | POA: Insufficient documentation

## 2016-03-16 NOTE — Therapy (Signed)
[facility] [address] [place], [place], [address] Phone: [phone]   Fax:  [phone]  Physical Therapy Treatment  Patient Details  Name: Brendan Holmes MRN: [id number] Date of Birth: [birth year] Referring Provider: Andrey Spearman, MD  Encounter Date: 03/16/2016      PT End of Session - 03/16/16 0935    Visit Number 9   Number of Visits 17  updated POC   Date for PT Re-Evaluation 04/15/16  Per updated POC   Authorization Type self pay   PT Start Time 0930  D/C visit, did not need full time   PT Stop Time 1005   PT Time Calculation (min) 35 min   Activity Tolerance Patient tolerated treatment well   Behavior During Therapy [name] for tasks assessed/performed      History reviewed. No pertinent past medical history.  Past Surgical History:  Procedure Laterality Date  . HAND SURGERY Right 1997   Pins placed    There were no vitals filed for this visit.      Subjective Assessment - 03/16/16 1023    Subjective "I feel like it is getting a little bit better but its still there most of the time and I feel the fullness when I push on it or look up/down."    Limitations Walking;Standing   Patient Stated Goals "I want this thing on my neck to go away."     Currently in Pain? Yes   Pain Score 4    Pain Location Neck   Pain Orientation Right   Pain Descriptors / Indicators Pressure   Pain Type Acute pain   Pain Onset More than a month ago   Pain Frequency Constant   Aggravating Factors  looking down, looking up, pressure to knot in neck   Pain Relieving Factors rest, heat                         [facility] Adult PT Treatment/Exercise - 03/16/16 0001      Self-Care   Self-Care Other Self-Care Comments   Other Self-Care Comments  Had lengthy discussion with pt regarding continued deficits, progress, referred pain patterns, transferring care to OP at [name] for more specialized  orthopedic care and possible dry needling for muscle tension.  Pt verbalized understanding and PT answered all questions to best of ability following                 PT Education - 03/16/16 1139    Education provided Yes   Education Details see self care section   Person(s) Educated Patient   Methods Explanation   Comprehension Verbalized understanding          PT Short Term Goals - 01/30/16 1650      PT SHORT TERM GOAL #1   Title =LTG's           PT Long Term Goals - 03/16/16 0946      PT LONG TERM GOAL #1   Title Pt will be independent with cervical and postural HEP in order to indicate improved functional mobility and decreased pain.  (Updated Target Date: 04/12/16)   Baseline met 03/16/16   Time 4   Period Weeks   Status On-going     PT LONG TERM GOAL #2   Title Pt will report no more than 3/10 pain with all functional mobility in order to indicate pain is decreased limiting factor.  (Updated Date: 04/13/16)   Baseline reports  average 4/10 on a daily basis 03/16/16   Time 4   Period Weeks   Status On-going     PT LONG TERM GOAL #3   Title Will assess NDI and improve by 20% in order to indicate improved self reported pain with functional mobility.  (Updated Target Date: 04/12/16)   Baseline 46% on 03/16/16   Time 4   Period Weeks   Status Revised     PT LONG TERM GOAL #4   Title Pt will improve cervical extension to 30 deg in order to indicate improved ability to perform work duties.     Baseline 30 deg ext on 03/16/16   Time 4   Period Weeks   Status Achieved     PT LONG TERM GOAL #5   Title Pt will improve cervical flexion to 40 deg in order to indicate improved ability to perform work duties.     Baseline 50 deg flex on 03/16/16   Time 4   Period Weeks   Status Achieved     Additional Long Term Goals   Additional Long Term Goals Yes     PT LONG TERM GOAL #6   Title Pt will increase R cervical rotation to 60 deg and L cervical rotation to 50  deg in order to indicate improved safety with driving.  (Target Date: 04/12/16)   Time 4   Period Weeks   Status New               Plan - 03/16/16 0936    Clinical Impression Statement Skilled session focused on assessment of LTGs and discussion of transferring pt to OP at [name] for more specialized orthopedic treatment and varying modalities (possible dry needling).  Pt has met goals, however is still limited greatly by pain with head motion, esp related to work activities and driving.  Feel that he would benefit from continued therapy to improve pain and increase functional mobility and job activities.  Pt verbalized understanding and agreement.     Rehab Potential Good   Clinical Impairments Affecting Rehab Potential unsure of grade of muscle tear   PT Frequency 2x / week   PT Duration 4 weeks   PT Treatment/Interventions ADLs/Self Care Home Management;Electrical Stimulation;Moist Heat;Ultrasound;Neuromuscular re-education;Patient/family education;Manual techniques;Taping   PT Next Visit Plan [name] re-certed for 4 more weeks for OP at [facility], also set new goals, modify these as you see fit! (unsure if he would benefit from dry needling?)   Consulted and Agree with Plan of Care Patient      Patient will benefit from skilled therapeutic intervention in order to improve the following deficits and impairments:  Decreased mobility, Decreased range of motion, Decreased strength, Increased muscle spasms, Improper body mechanics, Postural dysfunction, Pain  Visit Diagnosis: Cervicalgia  Abnormal posture     Problem List There are no active problems to display for this patient.   Denice Bors 03/16/2016, 11:48 AM  [facility] [address] [place], [place], [address] Phone: [phone]   Fax:  [phone]  Name: Brendan Holmes MRN: [id number] Date of Birth: June 04, 1970

## 2016-03-16 NOTE — Addendum Note (Signed)
Addended by: Cameron Sprang A on: 03/16/2016 11:51 AM   Modules accepted: Orders

## 2016-03-21 ENCOUNTER — Telehealth: Payer: Self-pay | Admitting: *Deleted

## 2016-03-21 NOTE — Telephone Encounter (Signed)
Attempted to reach patient re: reschedule FU due to dr out of office; phone was picked up, no one spoke then call was ended. Will try to reach later.

## 2016-03-22 NOTE — Telephone Encounter (Signed)
LVM requesting patient  call and reschedule his FU appointment due to provider being out of the office. Left number and advised the phone staff may reschedule for him.

## 2016-03-23 ENCOUNTER — Encounter: Payer: Self-pay | Admitting: *Deleted

## 2016-03-23 NOTE — Telephone Encounter (Signed)
Follow up on 04/27/16 cancelled. Letter mailed to patient requesting he call to reschedule appointment due to provider being out of the office.

## 2016-03-27 ENCOUNTER — Ambulatory Visit: Payer: Self-pay | Admitting: Physical Therapy

## 2016-03-27 DIAGNOSIS — R293 Abnormal posture: Secondary | ICD-10-CM

## 2016-03-27 DIAGNOSIS — M542 Cervicalgia: Secondary | ICD-10-CM

## 2016-03-27 NOTE — Therapy (Signed)
[facility] [place], [place], [address] Phone: [phone]   Fax:  [phone]  Physical Therapy Treatment  Patient Details  Name: Brendan Holmes MRN: [id number] Date of Birth: 04-07-1971 Referring Provider: Andrey Spearman, MD  Encounter Date: 03/27/2016      PT End of Session - 03/27/16 1512    Visit Number 10   Number of Visits 17   Date for PT Re-Evaluation 04/15/16   Authorization Type self pay   PT Start Time 0923   PT Stop Time 1232   PT Time Calculation (min) 47 min   Activity Tolerance Patient tolerated treatment well   Behavior During Therapy [facility] for tasks assessed/performed      No past medical history on file.  Past Surgical History:  Procedure Laterality Date  . HAND SURGERY Right 1997   Pins placed    There were no vitals filed for this visit.      Subjective Assessment - 03/27/16 1502    Subjective Patient reports over the weekend he was able to drive to [place] and had very little pain. He continues to have pain when looking down at his phone.    Limitations Walking;Standing   How long can you sit comfortably? No limit    Patient Stated Goals "I want this thing on my neck to go away."     Currently in Pain? Yes   Pain Score 4    Pain Location Neck   Pain Orientation Right   Pain Descriptors / Indicators Pressure   Pain Type Acute pain   Pain Radiating Towards up into the back of the head    Pain Onset More than a month ago   Aggravating Factors  looking down, looking up    Pain Relieving Factors rest, heat    Multiple Pain Sites No                         [facility] Adult PT Treatment/Exercise - 03/27/16 0001      Neck Exercises: Supine   Other Supine Exercise supine ER red, supine horrizontal abdcution red 2x10; d2 flexion 2x10 each arm      Manual Therapy   Manual Therapy Soft tissue mobilization;Myofascial release   Manual therapy comments for  decreased pain and muscle tightness. IASTMY to right upper tram and right cervical praspinals in order to facilitate healing and break up any dscar tissue that has formed.                 PT Education - 03/27/16 1512    Education Details Use of IASTYM, functional strengthening exercises    Person(s) Educated Patient   Methods Explanation   Comprehension Verbalized understanding          PT Short Term Goals - 01/30/16 1650      PT SHORT TERM GOAL #1   Title =LTG's           PT Long Term Goals - 03/27/16 1518      PT LONG TERM GOAL #1   Title Pt will be independent with cervical and postural HEP in order to indicate improved functional mobility and decreased pain.  (Updated Target Date: 04/12/16)   Baseline met 03/16/16   Time 4   Period Weeks   Status On-going     PT LONG TERM GOAL #2   Title Pt will report no more than 3/10 pain with all functional mobility in order to indicate pain  is decreased limiting factor.  (Updated Date: 04/13/16)   Baseline reports average 4/10 on a daily basis 03/16/16   Time 4   Period Weeks   Status On-going     PT LONG TERM GOAL #3   Title Will assess NDI and improve by 20% in order to indicate improved self reported pain with functional mobility.  (Updated Target Date: 04/12/16)   Baseline 46% on 03/16/16   Time 4   Period Weeks   Status On-going     PT LONG TERM GOAL #4   Title Pt will improve cervical extension to 30 deg in order to indicate improved ability to perform work duties.     Baseline 30 deg ext on 03/16/16   Time 4   Period Weeks   Status Achieved     PT LONG TERM GOAL #5   Title Pt will improve cervical flexion to 40 deg in order to indicate improved ability to perform work duties.     Baseline 50 deg flex on 03/16/16   Time 4   Period Weeks   Status Achieved     PT LONG TERM GOAL #6   Title Pt will increase R cervical rotation to 60 deg and L cervical rotation to 50 deg in order to indicate improved safety  with driving.  (Target Date: 04/12/16)   Time 4   Period Weeks   Status On-going               Plan - 03/27/16 1514    Clinical Impression Statement Patient tolerated treatment wll. He had improved pain with treatment. Therapy discussed needling with the paitnet but he may not be appropriate at this time 2nd to tear. If the patient does no progress we moy consider needling his peripheral muscles.    Rehab Potential Good   Clinical Impairments Affecting Rehab Potential unsure of grade of muscle tear   PT Frequency 2x / week   PT Duration 8 weeks   PT Treatment/Interventions ADLs/Self Care Home Management;Electrical Stimulation;Moist Heat;Ultrasound;Neuromuscular re-education;Patient/family education;Manual techniques;Taping   PT Next Visit Plan Continue with IASTYM, consider manual thrigger point release and sub occipital release, continue to work on extension with mulligan mobilizations. Continue with functional strengthening.    PT Home Exercise Plan supine D2 flexio: Supine ER red, supine Horizontal abduction    Consulted and Agree with Plan of Care Patient      Patient will benefit from skilled therapeutic intervention in order to improve the following deficits and impairments:  Decreased mobility, Decreased range of motion, Decreased strength, Increased muscle spasms, Improper body mechanics, Postural dysfunction, Pain  Visit Diagnosis: Cervicalgia  Abnormal posture     Problem List There are no active problems to display for this patient.   Carney Living PT DPT  03/27/2016, 3:25 PM  [facility] [address] [place], [place], [address] Phone: [phone]   Fax:  [phone]  Name: Brendan Holmes MRN: [id number] Date of Birth: Jan 01, 1971

## 2016-04-02 ENCOUNTER — Ambulatory Visit: Payer: Self-pay | Admitting: Physical Therapy

## 2016-04-04 ENCOUNTER — Ambulatory Visit: Payer: Self-pay | Admitting: Physical Therapy

## 2016-04-04 DIAGNOSIS — M542 Cervicalgia: Secondary | ICD-10-CM

## 2016-04-04 DIAGNOSIS — R293 Abnormal posture: Secondary | ICD-10-CM

## 2016-04-04 NOTE — Therapy (Signed)
[place], [place], [address] Phone: [phone]   Fax:  [phone]  Physical Therapy Treatment  Patient Details  Name: Brendan Holmes MRN: [id number] Date of Birth: 1971/02/26 Referring Provider: Andrey Spearman, MD  Encounter Date: 04/04/2016      PT End of Session - 04/04/16 1425    Visit Number 11   Number of Visits 17   Date for PT Re-Evaluation 04/15/16   Authorization Type self pay   PT Start Time 3354   PT Stop Time 1227   PT Time Calculation (min) 42 min   Activity Tolerance Patient tolerated treatment well   Behavior During Therapy [facility] for tasks assessed/performed      No past medical history on file.  Past Surgical History:  Procedure Laterality Date  . HAND SURGERY Right 1997   Pins placed    There were no vitals filed for this visit.      Subjective Assessment - 04/04/16 1421    Subjective Patient was sore after the last visit but it has been feeling better overall. He has been working. He has pain at times.    Limitations Walking;Standing   Patient Stated Goals "I want this thing on my neck to go away."     Currently in Pain? Yes   Pain Score 4    Pain Location Knee   Pain Orientation Right   Pain Descriptors / Indicators Pressure   Pain Type Acute pain   Pain Onset More than a month ago   Pain Frequency Constant   Aggravating Factors  looking down. looking    Pain Relieving Factors rest, heat    Multiple Pain Sites No                         [facility] Adult PT Treatment/Exercise - 04/04/16 0001      Neck Exercises: Standing   Other Standing Exercises standing shoulder flexion x10 3lb shoulder scaption 3lb 2x10; wall push ups 2x10      Neck Exercises: Supine   Other Supine Exercise supine ER green , supine horrizontal abdcution red 2x10; d2 flexion 2x10 each arm      Manual Therapy   Manual Therapy Soft tissue mobilization;Myofascial release   Manual therapy comments for decreased pain and muscle tightness. IASTMY to right upper tram and right cervical praspinals in order to facilitate healing and break up any dscar tissue that has formed.                 PT Education - 04/04/16 1424    Education provided Yes   Education Details improtance of strengthening    Person(s) Educated Patient   Methods Explanation   Comprehension Verbalized understanding;Returned demonstration;Verbal cues required;Tactile cues required          PT Short Term Goals - 01/30/16 1650      PT SHORT TERM GOAL #1   Title =LTG's           PT Long Term Goals - 03/27/16 1518      PT LONG TERM GOAL #1   Title Pt will be independent with cervical and postural HEP in order to indicate improved functional mobility and decreased pain.  (Updated Target Date: 04/12/16)   Baseline met 03/16/16   Time 4   Period Weeks   Status On-going     PT LONG TERM GOAL #2   Title Pt will report no more than 3/10 pain with  all functional mobility in order to indicate pain is decreased limiting factor.  (Updated Date: 04/13/16)   Baseline reports average 4/10 on a daily basis 03/16/16   Time 4   Period Weeks   Status On-going     PT LONG TERM GOAL #3   Title Will assess NDI and improve by 20% in order to indicate improved self reported pain with functional mobility.  (Updated Target Date: 04/12/16)   Baseline 46% on 03/16/16   Time 4   Period Weeks   Status On-going     PT LONG TERM GOAL #4   Title Pt will improve cervical extension to 30 deg in order to indicate improved ability to perform work duties.     Baseline 30 deg ext on 03/16/16   Time 4   Period Weeks   Status Achieved     PT LONG TERM GOAL #5   Title Pt will improve cervical flexion to 40 deg in order to indicate improved ability to perform work duties.     Baseline 50 deg flex on 03/16/16   Time 4   Period Weeks   Status Achieved     PT LONG TERM GOAL #6   Title Pt will increase R  cervical rotation to 60 deg and L cervical rotation to 50 deg in order to indicate improved safety with driving.  (Target Date: 04/12/16)   Time 4   Period Weeks   Status On-going               Plan - 04/04/16 1439    Clinical Impression Statement Patient is making good progress at this time. He was able to tolerate exercises well. He was able to perfrom exercises with a heavier band. Towrds the end he could feel some tension in the back of his head.    Rehab Potential Good   Clinical Impairments Affecting Rehab Potential unsure of grade of muscle tear   PT Frequency 2x / week   PT Duration 8 weeks   PT Treatment/Interventions ADLs/Self Care Home Management;Electrical Stimulation;Moist Heat;Ultrasound;Neuromuscular re-education;Patient/family education;Manual techniques;Taping   PT Next Visit Plan Continue with IASTYM, consider manual thrigger point release and sub occipital release, continue to work on extension with mulligan mobilizations. Continue with functional strengthening.    PT Home Exercise Plan supine D2 flexio: Supine ER red, supine Horizontal abduction    Consulted and Agree with Plan of Care Patient      Patient will benefit from skilled therapeutic intervention in order to improve the following deficits and impairments:  Decreased mobility, Decreased range of motion, Decreased strength, Increased muscle spasms, Improper body mechanics, Postural dysfunction, Pain  Visit Diagnosis: Cervicalgia  Abnormal posture     Problem List There are no active problems to display for this patient.   Carney Living PT DPT  04/04/2016, 2:41 PM  [facility] [address] [place], [place], [address] Phone: [phone]   Fax:  [phone]  Name: Brendan Holmes MRN: [id number] Date of Birth: 1970/10/18

## 2016-04-18 ENCOUNTER — Ambulatory Visit: Payer: Self-pay | Attending: Diagnostic Neuroimaging | Admitting: Physical Therapy

## 2016-04-18 DIAGNOSIS — M542 Cervicalgia: Secondary | ICD-10-CM

## 2016-04-18 DIAGNOSIS — R293 Abnormal posture: Secondary | ICD-10-CM

## 2016-04-19 NOTE — Therapy (Signed)
[place], [place], [address] Phone: [phone]   Fax:  [phone]  Physical Therapy Treatment  Patient Details  Name: Brendan Holmes MRN: [id number] Date of Birth: 1971/02/22 Referring Provider: Andrey Spearman, MD  Encounter Date: 04/18/2016      PT End of Session - 04/18/16 1603    Visit Number 12   Number of Visits 17   Date for PT Re-Evaluation 04/15/16   Authorization Type self pay   PT Start Time 1100   PT Stop Time 1141   PT Time Calculation (min) 41 min   Activity Tolerance Patient tolerated treatment well   Behavior During Therapy [facility] for tasks assessed/performed      No past medical history on file.  Past Surgical History:  Procedure Laterality Date  . HAND SURGERY Right 1997   Pins placed    There were no vitals filed for this visit.      Subjective Assessment - 04/18/16 1600    Subjective Patient reports the pain is becoming less frequent. On new years fday he was lifting a heavey well cover and he had a pain in hi neck. It hurt that night but was better the next day. He continues to have focal swelling on the lleft side of his cervical paraspinals but it seems improved from the lkast visit.    Limitations Walking;Standing   How long can you sit comfortably? No limit    Patient Stated Goals "I want this thing on my neck to go away."     Currently in Pain? No/denies                         [facility] Adult PT Treatment/Exercise - 04/19/16 0001      Neck Exercises: Standing   Other Standing Exercises standing shoulder flexion x10 3lb shoulder scaption 3lb 2x10; wall push ups 2x10      Neck Exercises: Supine   Other Supine Exercise seated ER blue  , seated horrizontal abdcution blue 2x10;      Manual Therapy   Manual Therapy Soft tissue mobilization;Myofascial release   Manual therapy comments for decreased pain and muscle tightness. IASTMY to right upper  tram and right cervical praspinals in order to facilitate healing and break up any dscar tissue that has formed.                 PT Education - 04/18/16 1602    Education provided Yes   Education Details improtance of stretghening, explained haling process    Person(s) Educated Patient   Methods Explanation   Comprehension Verbalized understanding;Returned demonstration;Verbal cues required          PT Short Term Goals - 01/30/16 1650      PT SHORT TERM GOAL #1   Title =LTG's           PT Long Term Goals - 03/27/16 1518      PT LONG TERM GOAL #1   Title Pt will be independent with cervical and postural HEP in order to indicate improved functional mobility and decreased pain.  (Updated Target Date: 04/12/16)   Baseline met 03/16/16   Time 4   Period Weeks   Status On-going     PT LONG TERM GOAL #2   Title Pt will report no more than 3/10 pain with all functional mobility in order to indicate pain is decreased limiting factor.  (Updated Date: 04/13/16)   Baseline reports average  4/10 on a daily basis 03/16/16   Time 4   Period Weeks   Status On-going     PT LONG TERM GOAL #3   Title Will assess NDI and improve by 20% in order to indicate improved self reported pain with functional mobility.  (Updated Target Date: 04/12/16)   Baseline 46% on 03/16/16   Time 4   Period Weeks   Status On-going     PT LONG TERM GOAL #4   Title Pt will improve cervical extension to 30 deg in order to indicate improved ability to perform work duties.     Baseline 30 deg ext on 03/16/16   Time 4   Period Weeks   Status Achieved     PT LONG TERM GOAL #5   Title Pt will improve cervical flexion to 40 deg in order to indicate improved ability to perform work duties.     Baseline 50 deg flex on 03/16/16   Time 4   Period Weeks   Status Achieved     PT LONG TERM GOAL #6   Title Pt will increase R cervical rotation to 60 deg and L cervical rotation to 50 deg in order to indicate  improved safety with driving.  (Target Date: 04/12/16)   Time 4   Period Weeks   Status On-going               Plan - 04/18/16 1603    Clinical Impression Statement Therapy advanced bands to blue band for ewxercises. He reported at one point a pull in his neck. After that he had some pain in his temple.    Rehab Potential Good   Clinical Impairments Affecting Rehab Potential unsure of grade of muscle tear   PT Treatment/Interventions ADLs/Self Care Home Management;Electrical Stimulation;Moist Heat;Ultrasound;Neuromuscular re-education;Patient/family education;Manual techniques;Taping   PT Next Visit Plan Continue with IASTYM, consider manual thrigger point release and sub occipital release, continue to work on extension with mulligan mobilizations. Continue with functional strengthening.    PT Home Exercise Plan supine D2 flexio: Supine ER red, supine Horizontal abduction    Consulted and Agree with Plan of Care Patient      Patient will benefit from skilled therapeutic intervention in order to improve the following deficits and impairments:  Decreased mobility, Decreased range of motion, Decreased strength, Increased muscle spasms, Improper body mechanics, Postural dysfunction, Pain  Visit Diagnosis: Cervicalgia  Abnormal posture     Problem List There are no active problems to display for this patient.   Carney Living  PT DPT  04/19/2016, 7:48 AM  [facility] [address] [place], [place], [address] Phone: [phone]   Fax:  [phone]  Name: Hisashi Amadon MRN: [id number] Date of Birth: 1970/06/03

## 2016-04-20 ENCOUNTER — Ambulatory Visit: Payer: Self-pay | Admitting: Physical Therapy

## 2016-04-20 DIAGNOSIS — R293 Abnormal posture: Secondary | ICD-10-CM

## 2016-04-20 DIAGNOSIS — M542 Cervicalgia: Secondary | ICD-10-CM

## 2016-04-20 NOTE — Therapy (Signed)
[place], [place], [address] Phone: [phone]   Fax:  [phone]  Physical Therapy Treatment  Patient Details  Name: Brendan Holmes MRN: [id number] Date of Birth: 07/02/1970 Referring Provider: Andrey Spearman, MD  Encounter Date: 04/20/2016      PT End of Session - 04/20/16 1309    Visit Number 13   Number of Visits 17   Date for PT Re-Evaluation 04/15/16   Authorization Type self pay   PT Start Time 0930   PT Stop Time 1013   PT Time Calculation (min) 43 min   Activity Tolerance Patient tolerated treatment well   Behavior During Therapy [facility] for tasks assessed/performed      No past medical history on file.  Past Surgical History:  Procedure Laterality Date  . HAND SURGERY Right 1997   Pins placed    There were no vitals filed for this visit.      Subjective Assessment - 04/20/16 1305    Subjective Had some pain whenchanging the litter box today but overall has not been having too much pain. Per visual inspection appears to be less swelling on the right side.    Limitations Walking;Standing   How long can you sit comfortably? No limit    Patient Stated Goals "I want this thing on my neck to go away."     Currently in Pain? No/denies                         [facility] Adult PT Treatment/Exercise - 04/20/16 0001      Neck Exercises: Standing   Other Standing Exercises wall push-up on ball 2x10; wall flexion into scpa reatraction 2x10; Standing t-band extension, retraction and flexion 2x10 with Blue band     Neck Exercises: Supine   Other Supine Exercise seated ER blue  , seated horrizontal abdcution blue 2x10;      Manual Therapy   Manual Therapy Soft tissue mobilization;Myofascial release   Manual therapy comments for decreased pain and muscle tightness. IASTMY to right upper tram and right cervical praspinals in order to facilitate healing and break up any dscar  tissue that has formed.                 PT Education - 04/20/16 1308    Education provided Yes   Education Details continue with stregthening    Person(s) Educated Patient   Methods Explanation   Comprehension Verbalized understanding;Returned demonstration;Verbal cues required          PT Short Term Goals - 01/30/16 1650      PT SHORT TERM GOAL #1   Title =LTG's           PT Long Term Goals - 03/27/16 1518      PT LONG TERM GOAL #1   Title Pt will be independent with cervical and postural HEP in order to indicate improved functional mobility and decreased pain.  (Updated Target Date: 04/12/16)   Baseline met 03/16/16   Time 4   Period Weeks   Status On-going     PT LONG TERM GOAL #2   Title Pt will report no more than 3/10 pain with all functional mobility in order to indicate pain is decreased limiting factor.  (Updated Date: 04/13/16)   Baseline reports average 4/10 on a daily basis 03/16/16   Time 4   Period Weeks   Status On-going     PT LONG TERM GOAL #  3   Title Will assess NDI and improve by 20% in order to indicate improved self reported pain with functional mobility.  (Updated Target Date: 04/12/16)   Baseline 46% on 03/16/16   Time 4   Period Weeks   Status On-going     PT LONG TERM GOAL #4   Title Pt will improve cervical extension to 30 deg in order to indicate improved ability to perform work duties.     Baseline 30 deg ext on 03/16/16   Time 4   Period Weeks   Status Achieved     PT LONG TERM GOAL #5   Title Pt will improve cervical flexion to 40 deg in order to indicate improved ability to perform work duties.     Baseline 50 deg flex on 03/16/16   Time 4   Period Weeks   Status Achieved     PT LONG TERM GOAL #6   Title Pt will increase R cervical rotation to 60 deg and L cervical rotation to 50 deg in order to indicate improved safety with driving.  (Target Date: 04/12/16)   Time 4   Period Weeks   Status On-going                Plan - 04/20/16 1310    Clinical Impression Statement Patient tolerated treatment well. He had no significant increase in pain. He was able to do higher level activitys without pain into his temple today   Rehab Potential Good   Clinical Impairments Affecting Rehab Potential unsure of grade of muscle tear   PT Frequency 2x / week   PT Duration 8 weeks   PT Treatment/Interventions ADLs/Self Care Home Management;Electrical Stimulation;Moist Heat;Ultrasound;Neuromuscular re-education;Patient/family education;Manual techniques;Taping   PT Next Visit Plan Continue with IASTYM, , . Continue with functional strengthening.    PT Home Exercise Plan supine D2 flexio: Supine ER red, supine Horizontal abduction    Consulted and Agree with Plan of Care Patient      Patient will benefit from skilled therapeutic intervention in order to improve the following deficits and impairments:  Decreased mobility, Decreased range of motion, Decreased strength, Increased muscle spasms, Improper body mechanics, Postural dysfunction, Pain  Visit Diagnosis: Cervicalgia  Abnormal posture     Problem List There are no active problems to display for this patient.   Carney Living PT DPT  04/20/2016, 1:11 PM  [facility] [address] [place], [place], [address] Phone: [phone]   Fax:  [phone]  Name: Brendan Holmes MRN: [id number] Date of Birth: 07/14/1970

## 2016-04-24 ENCOUNTER — Ambulatory Visit: Payer: Self-pay | Admitting: Physical Therapy

## 2016-04-24 DIAGNOSIS — R293 Abnormal posture: Secondary | ICD-10-CM

## 2016-04-24 DIAGNOSIS — M542 Cervicalgia: Secondary | ICD-10-CM

## 2016-04-24 NOTE — Therapy (Signed)
[place], [place], [address] Phone: [phone]   Fax:  [phone]  Physical Therapy Treatment  Patient Details  Name: Kassius Battiste MRN: [id number] Date of Birth: Oct 09, 1970 Referring Provider: Andrey Spearman, MD  Encounter Date: 04/24/2016      PT End of Session - 04/24/16 1157    Visit Number 14   Number of Visits 17   Date for PT Re-Evaluation 04/15/16   Authorization Type self pay   PT Start Time 1148   PT Stop Time 1230   PT Time Calculation (min) 42 min      No past medical history on file.  Past Surgical History:  Procedure Laterality Date  . HAND SURGERY Right 1997   Pins placed    There were no vitals filed for this visit.      Subjective Assessment - 04/24/16 1105    Subjective The bump on my neck may be getting smaller. It's  frustrating because it has been 6 months. Sill cant look up or down without pain.    Currently in Pain? Yes   Pain Score 4    Pain Location Neck   Pain Orientation Right   Pain Descriptors / Indicators Pressure   Aggravating Factors  looking up, looking down   Pain Relieving Factors rest, heat                          [facility] Adult PT Treatment/Exercise - 04/24/16 0001      Neck Exercises: Seated   Neck Retraction 10 reps     Neck Exercises: Supine   Other Supine Exercise Supine chin tucks x 10, then chin tuck with arm circles, horizontal abduction and adduction, alternating UE flex and ext. Then supine with Blue Band supine horizontal abduction with bliue band and ER blue band 10 x 2 each all with chin tuck, pullovers 4# with chin tuck      Manual Therapy   Manual therapy comments for decreased pain and muscle tightness. IASTMY to right upper tram and right cervical praspinals in order to facilitate healing and break up any dscar tissue that has formed.                   PT Short Term Goals - 01/30/16 1650      PT  SHORT TERM GOAL #1   Title =LTG's           PT Long Term Goals - 03/27/16 1518      PT LONG TERM GOAL #1   Title Pt will be independent with cervical and postural HEP in order to indicate improved functional mobility and decreased pain.  (Updated Target Date: 04/12/16)   Baseline met 03/16/16   Time 4   Period Weeks   Status On-going     PT LONG TERM GOAL #2   Title Pt will report no more than 3/10 pain with all functional mobility in order to indicate pain is decreased limiting factor.  (Updated Date: 04/13/16)   Baseline reports average 4/10 on a daily basis 03/16/16   Time 4   Period Weeks   Status On-going     PT LONG TERM GOAL #3   Title Will assess NDI and improve by 20% in order to indicate improved self reported pain with functional mobility.  (Updated Target Date: 04/12/16)   Baseline 46% on 03/16/16   Time 4   Period Weeks   Status On-going  PT LONG TERM GOAL #4   Title Pt will improve cervical extension to 30 deg in order to indicate improved ability to perform work duties.     Baseline 30 deg ext on 03/16/16   Time 4   Period Weeks   Status Achieved     PT LONG TERM GOAL #5   Title Pt will improve cervical flexion to 40 deg in order to indicate improved ability to perform work duties.     Baseline 50 deg flex on 03/16/16   Time 4   Period Weeks   Status Achieved     PT LONG TERM GOAL #6   Title Pt will increase R cervical rotation to 60 deg and L cervical rotation to 50 deg in order to indicate improved safety with driving.  (Target Date: 04/12/16)   Time 4   Period Weeks   Status On-going               Plan - 04/24/16 1200    Clinical Impression Statement Repeated manual. Pt thinks knot may be getting smaller. Still difficulty with looking up, down and has pain going over bumps in the road and railroad tracks. Trials of chin tucks in stting and supine without pain. Perfromed all therex today in supine maintaining chin tuck. Instructed pt to  utilize chin tuck when going over bumps in the road. Will assess toelrance to this treatment next visit. At end of treatment pt reports no increased pain. He states he always feels more pain later in the day.   PT Next Visit Plan Check tolerance to this treatment. He has one more visit scheduled? Continue with IASTYM, , . Continue with functional strengthening.    PT Home Exercise Plan supine D2 flexio: Supine ER red, supine Horizontal abduction    Consulted and Agree with Plan of Care Patient      Patient will benefit from skilled therapeutic intervention in order to improve the following deficits and impairments:  Decreased mobility, Decreased range of motion, Decreased strength, Increased muscle spasms, Improper body mechanics, Postural dysfunction, Pain  Visit Diagnosis: Cervicalgia  Abnormal posture     Problem List There are no active problems to display for this patient.   Dorene Ar, [place] 04/24/2016, 12:07 PM  [facility] [address] [place], [place], [address] Phone: [phone]   Fax:  [phone]  Name: Charlie Seda MRN: [id number] Date of Birth: 08-09-1970

## 2016-04-26 ENCOUNTER — Encounter: Payer: Self-pay | Admitting: Physical Therapy

## 2016-04-26 ENCOUNTER — Ambulatory Visit: Payer: Self-pay | Admitting: Physical Therapy

## 2016-04-26 DIAGNOSIS — M542 Cervicalgia: Secondary | ICD-10-CM

## 2016-04-26 DIAGNOSIS — R293 Abnormal posture: Secondary | ICD-10-CM

## 2016-04-26 NOTE — Therapy (Signed)
[place], [place], [address] Phone: [phone]   Fax:  [phone]  Physical Therapy Treatment/ Re-cert   Patient Details  Name: Brendan Holmes MRN: [id number] Date of Birth: 1970-05-02 Referring Provider: Andrey Spearman, MD  Encounter Date: 04/26/2016      PT End of Session - 04/26/16 1320    Visit Number 15   Number of Visits 17   Date for PT Re-Evaluation 04/15/16   Authorization Type self pay   PT Start Time [id number]   PT Stop Time 1232   PT Time Calculation (min) 43 min   Activity Tolerance Patient tolerated treatment well   Behavior During Therapy [facility] for tasks assessed/performed      History reviewed. No pertinent past medical history.  Past Surgical History:  Procedure Laterality Date  . HAND SURGERY Right 1997   Pins placed    There were no vitals filed for this visit.      Subjective Assessment - 04/26/16 1314    Subjective Patient reports his neck was feeling good after treatment but yesterday he got a headache again. He feels like it may be getting a little better but it comes on at times during work.    Limitations Walking;Standing   How long can you sit comfortably? No limit    Patient Stated Goals "I want this thing on my neck to go away."     Currently in Pain? Yes   Pain Score 3    Pain Location Neck   Pain Orientation Right   Pain Descriptors / Indicators Pressure   Pain Type Acute pain   Pain Onset More than a month ago   Pain Frequency Constant   Aggravating Factors  loking up / looking down    Pain Relieving Factors rest, heat    Multiple Pain Sites No            [facility] PT Assessment - 04/26/16 0001      Posture/Postural Control   Posture/Postural Control Postural limitations   Postural Limitations Rounded Shoulders;Forward head   Posture Comments Working on psotural correction      AROM   Overall AROM Comments cervical rotation R 60 deg, L 65 deg  (both with pain), cervical ext 24 deg, flex 36 deg, lateral flex 20 deg B      Palpation   Palpation comment continues to have spasming around the upper trap and levator                             PT Education - 04/26/16 1320    Education Details use of sub occipital release to improve tomporal and posterior head pain    Person(s) Educated Patient   Methods Explanation   Comprehension Verbalized understanding;Returned demonstration;Verbal cues required          PT Short Term Goals - 04/26/16 1526      PT SHORT TERM GOAL #1   Title =LTG's           PT Long Term Goals - 04/26/16 1330      PT LONG TERM GOAL #1   Title Pt will be independent with cervical and postural HEP in order to indicate improved functional mobility and decreased pain.  (Updated Target Date: 04/12/16)   Baseline therapy continues to progress exercises.    Time 6   Period Weeks   Status On-going     PT LONG TERM GOAL #2  Title Pt will report no more than 3/10 pain with all functional mobility in order to indicate pain is decreased limiting factor.  (Updated Date: 04/13/16)   Baseline reports average 4/10 on a daily basis 03/16/16   Time 6   Period Weeks   Status On-going     PT LONG TERM GOAL #3   Title Will assess NDI and improve by 20% in order to indicate improved self reported pain with functional mobility.  (Updated Target Date: 04/12/16)   Baseline 46% on 03/16/16   Time 4   Period Weeks   Status On-going     PT LONG TERM GOAL #4   Title Patient will demsotrate full cervical extension without increased pain    Time 4   Period Weeks   Status Achieved     PT LONG TERM GOAL #5   Title Pt will demsotrate full cervical flexion without increased pain    Baseline 50 deg flex on 03/16/16   Time 4   Period Weeks   Status Achieved     PT LONG TERM GOAL #6   Title Pt will increase R cervical rotation to 60 deg and L cervical rotation to 50 deg in order to indicate  improved safety with driving.  (Target Date: 04/12/16)   Baseline full rtoation but continues to have pain    Time 4               Plan - 04/26/16 1325    Clinical Impression Statement Patient feels like he is making slow progress. Therapy continues to progrress weight ctivity. He was able to perfrom machine exercises witout an increase in pain. He also continues to work on chin tucks. He would benefit from further therapy to continue to decrease pain and increase functional strength.    Rehab Potential Good   Clinical Impairments Affecting Rehab Potential unsure of grade of muscle tear   PT Frequency 2x / week   PT Duration 8 weeks   PT Treatment/Interventions ADLs/Self Care Home Management;Electrical Stimulation;Moist Heat;Ultrasound;Neuromuscular re-education;Patient/family education;Manual techniques;Taping   PT Next Visit Plan Check tolerance to this treatment. He has one more visit scheduled? Continue with IASTYM, , . Continue with functional strengthening.    PT Home Exercise Plan supine D2 flexio: Supine ER red, supine Horizontal abduction    Consulted and Agree with Plan of Care Patient      Patient will benefit from skilled therapeutic intervention in order to improve the following deficits and impairments:  Decreased mobility, Decreased range of motion, Decreased strength, Increased muscle spasms, Improper body mechanics, Postural dysfunction, Pain  Visit Diagnosis: Cervicalgia - Plan: PT plan of care cert/re-cert  Abnormal posture - Plan: PT plan of care cert/re-cert     Problem List There are no active problems to display for this patient.   Carney Living PT DPT  04/26/2016, 3:32 PM  [facility] [address] [place], [place], [address] Phone: [phone]   Fax:  [phone]  Name: Delvante Hilling MRN: [id number] Date of Birth: [birth year]

## 2016-04-27 ENCOUNTER — Ambulatory Visit: Payer: Self-pay | Admitting: Diagnostic Neuroimaging

## 2016-05-07 ENCOUNTER — Encounter: Payer: Self-pay | Admitting: Physical Therapy

## 2016-05-07 ENCOUNTER — Ambulatory Visit: Payer: Self-pay | Admitting: Physical Therapy

## 2016-05-07 DIAGNOSIS — M542 Cervicalgia: Secondary | ICD-10-CM

## 2016-05-07 DIAGNOSIS — R293 Abnormal posture: Secondary | ICD-10-CM

## 2016-05-07 NOTE — Therapy (Signed)
[place], [place], [address] Phone: [phone]   Fax:  [phone]  Physical Therapy Treatment  Patient Details  Name: Brendan Holmes MRN: [id number] Date of Birth: 08-30-1970 Referring Provider: Andrey Spearman, MD  Encounter Date: 05/07/2016      PT End of Session - 05/07/16 1629    Visit Number 16   Number of Visits 29   Date for PT Re-Evaluation 06/18/16   Authorization Type self pay   PT Start Time [id number]   PT Stop Time 1226   PT Time Calculation (min) 41 min   Activity Tolerance Patient tolerated treatment well   Behavior During Therapy [facility] for tasks assessed/performed      History reviewed. No pertinent past medical history.  Past Surgical History:  Procedure Laterality Date  . HAND SURGERY Right 1997   Pins placed    There were no vitals filed for this visit.      Subjective Assessment - 05/07/16 1625    Subjective Patient reports he has good days but then he will have a day when he has pain He feels more pain when he is looking down at his phone for long periods of time. He is having some swelling today.    Limitations Walking;Standing   How long can you sit comfortably? No limit    Patient Stated Goals "I want this thing on my neck to go away."     Currently in Pain? Yes   Pain Score 3    Pain Location Neck   Pain Orientation Right   Pain Descriptors / Indicators Pressure   Pain Type Acute pain   Pain Radiating Towards pain into the temples    Pain Onset More than a month ago   Pain Frequency Constant   Aggravating Factors  looking fdown    Pain Relieving Factors rest, heat                          [facility] Adult PT Treatment/Exercise - 05/07/16 0001      Neck Exercises: Machines for Strengthening   Cybex Row 10x2 cues for posture/ scapula retraction, #45   Power Tower Lat pulldown 2x10 45#     Neck Exercises: Seated   Other Seated Exercise chin tuck  2x10      Manual Therapy   Manual Therapy Soft tissue mobilization;Myofascial release   Manual therapy comments for decreased pain and muscle tightness. IASTM to right upper tram and right cervical praspinals in order to facilitate healing and break up any scar tissue that has formed. Mulligan flexion mobilization as 5x3                  PT Education - 05/07/16 1627    Education provided Yes   Education Details continue with stretching and strengthening    Person(s) Educated Patient   Methods Explanation   Comprehension Verbalized understanding;Returned demonstration;Verbal cues required;Need further instruction          PT Short Term Goals - 04/26/16 1526      PT SHORT TERM GOAL #1   Title =LTG's           PT Long Term Goals - 05/07/16 1631      PT LONG TERM GOAL #1   Title Pt will be independent with cervical and postural HEP in order to indicate improved functional mobility and decreased pain.  (Updated Target Date: 04/12/16)   Baseline therapy continues to  progress exercises.    Time 6   Period Weeks   Status On-going     PT LONG TERM GOAL #2   Title Pt will report no more than 3/10 pain with all functional mobility in order to indicate pain is decreased limiting factor.  (Updated Date: 04/13/16)   Baseline reports average 4/10 on a daily basis 03/16/16   Time 6   Period Weeks   Status On-going     PT LONG TERM GOAL #3   Title Will assess NDI and improve by 20% in order to indicate improved self reported pain with functional mobility.  (Updated Target Date: 04/12/16)   Baseline 46% on 03/16/16   Time 4   Period Weeks   Status On-going     PT LONG TERM GOAL #4   Title Patient will demsotrate full cervical extension without increased pain    Baseline 30 deg ext on 03/16/16   Time 4   Period Weeks   Status On-going     PT LONG TERM GOAL #5   Title Pt will demsotrate full cervical flexion without increased pain    Baseline 50 deg flex on 03/16/16    Time 4   Period Weeks   Status On-going     PT LONG TERM GOAL #6   Title Pt will increase R cervical rotation to 60 deg and L cervical rotation to 50 deg in order to indicate improved safety with driving.  (Target Date: 04/12/16)   Baseline full rtoation but continues to have pain    Time 4   Period Weeks   Status On-going               Plan - 05/07/16 1629    Clinical Impression Statement Patient had improved pain with flexion aftermulligan mobilization of C4/C5 . He tolerated all other treamtnet well. Improved swelling with IASTM    Rehab Potential Good   Clinical Impairments Affecting Rehab Potential unsure of grade of muscle tear   PT Frequency 2x / week   PT Duration 8 weeks   PT Treatment/Interventions ADLs/Self Care Home Management;Electrical Stimulation;Moist Heat;Ultrasound;Neuromuscular re-education;Patient/family education;Manual techniques;Taping   PT Next Visit Plan Check tolerance to this treatment. He has one more visit scheduled? Continue with IASTYM, , . Continue with functional strengthening.    PT Home Exercise Plan supine D2 flexio: Supine ER red, supine Horizontal abduction    Consulted and Agree with Plan of Care Patient      Patient will benefit from skilled therapeutic intervention in order to improve the following deficits and impairments:  Decreased mobility, Decreased range of motion, Decreased strength, Increased muscle spasms, Improper body mechanics, Postural dysfunction, Pain  Visit Diagnosis: Cervicalgia  Abnormal posture     Problem List There are no active problems to display for this patient.   Carney Living PT DPT  05/07/2016, 4:33 PM  [facility] [address] [place], [place], [address] Phone: [phone]   Fax:  [phone]  Name: Brendan Holmes MRN: [id number] Date of Birth: 06-18-1970

## 2016-05-09 ENCOUNTER — Ambulatory Visit: Payer: Self-pay | Admitting: Physical Therapy

## 2016-05-09 DIAGNOSIS — R293 Abnormal posture: Secondary | ICD-10-CM

## 2016-05-09 DIAGNOSIS — M542 Cervicalgia: Secondary | ICD-10-CM

## 2016-05-09 NOTE — Therapy (Signed)
[place], [place], [address] Phone: [phone]   Fax:  [phone]  Physical Therapy Treatment  Patient Details  Name: Brendan Holmes MRN: [id number] Date of Birth: 1970/11/15 Referring Provider: Andrey Spearman, MD  Encounter Date: 05/09/2016      PT End of Session - 05/09/16 1710    Visit Number 1   Number of Visits 29   Date for PT Re-Evaluation 06/18/16   Authorization Type self pay   PT Start Time [id number]   PT Stop Time 1228   PT Time Calculation (min) 43 min   Activity Tolerance Patient tolerated treatment well   Behavior During Therapy [facility] for tasks assessed/performed      No past medical history on file.  Past Surgical History:  Procedure Laterality Date  . HAND SURGERY Right 1997   Pins placed    There were no vitals filed for this visit.      Subjective Assessment - 05/09/16 1705    Subjective Patient reports since the mulligan mobilizations the patient has been hacving much less pain when looking down. He continue to have some pain when rotating his head to the right    Limitations Walking;Standing   How long can you sit comfortably? No limit    Currently in Pain? Yes   Pain Score 1    Pain Location Neck   Pain Orientation Right   Pain Descriptors / Indicators Pressure   Pain Type Acute pain   Pain Radiating Towards pain into the temples    Pain Onset More than a month ago   Pain Frequency Constant   Aggravating Factors  looking down    Pain Relieving Factors rest, heat    Multiple Pain Sites No                         [facility] Adult PT Treatment/Exercise - 05/09/16 0001      Neck Exercises: Machines for Strengthening   Cybex Row 10x2 cues for posture/ scapula retraction, #45   Power Tower Lat pulldown 2x10 45#     Neck Exercises: Standing   Other Standing Exercises wall flexion with abduction 2x10      Neck Exercises: Seated   Other Seated  Exercise chin tuck 2x10      Manual Therapy   Manual Therapy Soft tissue mobilization;Myofascial release   Manual therapy comments for decreased pain and muscle tightness. IASTM to right upper tram and right cervical praspinals in order to facilitate healing and break up any scar tissue that has formed. Mulligan flexion mobilization as 5x3                  PT Education - 05/09/16 1709    Education Details reviewed purpoise of mulligan mobilizations    Person(s) Educated Patient   Methods Explanation   Comprehension Verbalized understanding;Returned demonstration;Need further instruction;Verbal cues required          PT Short Term Goals - 04/26/16 1526      PT SHORT TERM GOAL #1   Title =LTG's           PT Long Term Goals - 05/07/16 1631      PT LONG TERM GOAL #1   Title Pt will be independent with cervical and postural HEP in order to indicate improved functional mobility and decreased pain.  (Updated Target Date: 04/12/16)   Baseline therapy continues to progress exercises.    Time 6  Period Weeks   Status On-going     PT LONG TERM GOAL #2   Title Pt will report no more than 3/10 pain with all functional mobility in order to indicate pain is decreased limiting factor.  (Updated Date: 04/13/16)   Baseline reports average 4/10 on a daily basis 03/16/16   Time 6   Period Weeks   Status On-going     PT LONG TERM GOAL #3   Title Will assess NDI and improve by 20% in order to indicate improved self reported pain with functional mobility.  (Updated Target Date: 04/12/16)   Baseline 46% on 03/16/16   Time 4   Period Weeks   Status On-going     PT LONG TERM GOAL #4   Title Patient will demsotrate full cervical extension without increased pain    Baseline 30 deg ext on 03/16/16   Time 4   Period Weeks   Status On-going     PT LONG TERM GOAL #5   Title Pt will demsotrate full cervical flexion without increased pain    Baseline 50 deg flex on 03/16/16   Time 4    Period Weeks   Status On-going     PT LONG TERM GOAL #6   Title Pt will increase R cervical rotation to 60 deg and L cervical rotation to 50 deg in order to indicate improved safety with driving.  (Target Date: 04/12/16)   Baseline full rtoation but continues to have pain    Time 4   Period Weeks   Status On-going               Plan - 05/09/16 1710    Clinical Impression Statement Patient had improved rotation with mulligan mobilizations. He continues to tolerate exercises well. He would benefit from further strengthening as well as manual therapy to decrease spasming and improve ROM in all directiosn. Consider extension Mulligans next visit    Rehab Potential Good   Clinical Impairments Affecting Rehab Potential unsure of grade of muscle tear   PT Frequency 2x / week   PT Duration 8 weeks   PT Treatment/Interventions ADLs/Self Care Home Management;Electrical Stimulation;Moist Heat;Ultrasound;Neuromuscular re-education;Patient/family education;Manual techniques;Taping   PT Next Visit Plan Check tolerance to this treatment. He has one more visit scheduled? Continue with IASTYM, , . Continue with functional strengthening.    PT Home Exercise Plan supine D2 flexio: Supine ER red, supine Horizontal abduction    Consulted and Agree with Plan of Care Patient      Patient will benefit from skilled therapeutic intervention in order to improve the following deficits and impairments:  Decreased mobility, Decreased range of motion, Decreased strength, Increased muscle spasms, Improper body mechanics, Postural dysfunction, Pain  Visit Diagnosis: Cervicalgia  Abnormal posture     Problem List There are no active problems to display for this patient.   Carney Living PT DPT  05/09/2016, 5:15 PM  [facility] [address] [place], [place], [address] Phone: [phone]   Fax:  [phone]  Name: Brendan Holmes MRN:  [id number] Date of Birth: 29-Oct-1970

## 2016-05-14 ENCOUNTER — Ambulatory Visit: Payer: Self-pay | Admitting: Physical Therapy

## 2016-05-14 DIAGNOSIS — M542 Cervicalgia: Secondary | ICD-10-CM

## 2016-05-14 DIAGNOSIS — R293 Abnormal posture: Secondary | ICD-10-CM

## 2016-05-14 NOTE — Therapy (Signed)
[place], [place], [address] Phone: [phone]   Fax:  [phone]  Physical Therapy Treatment  Patient Details  Name: Brendan Holmes MRN: [id number] Date of Birth: February 14, 1971 Referring Provider: Andrey Spearman, MD  Encounter Date: 05/14/2016      PT End of Session - 05/14/16 1150    Visit Number 18   Number of Visits 29   Date for PT Re-Evaluation 06/18/16   Authorization Type self pay   PT Start Time [id number]   PT Stop Time 1230   PT Time Calculation (min) 46 min      No past medical history on file.  Past Surgical History:  Procedure Laterality Date  . HAND SURGERY Right 1997   Pins placed    There were no vitals filed for this visit.      Subjective Assessment - 05/14/16 1149    Currently in Pain? Yes   Pain Score 5    Pain Location Neck   Pain Orientation Right   Pain Descriptors / Indicators Pressure   Aggravating Factors  looking down   Pain Relieving Factors rest heat             [facility] PT Assessment - 05/14/16 0001      AROM   Overall AROM Comments Rotation R 65 L 55, Extension 40, flexion 50, Sidebend L 35, R 30                      [facility] Adult PT Treatment/Exercise - 05/14/16 0001      Neck Exercises: Machines for Strengthening   UBE (Upper Arm Bike) UBE level 2.5 2 min forward, 2 min backward    Cybex Row 10x2 cues for posture/ scapula retraction, #45   Power Tower Lat pulldown 2x10 45#     Neck Exercises: Supine   Other Supine Exercise  Then supine with Blue Band supine horizontal abduction with bliue band and ER blue band 10 x 2 each all with chin tuck, pullovers  with chin tuck      Manual Therapy   Manual therapy comments for decreased pain and muscle tightness. IASTM to right upper tram and right cervical praspinals in order to facilitate healing and break up any scar tissue that has formed. Mulligan flexion mobilization for flexion, extension  and right rotation performed by Carolyne Littles, PT                   PT Short Term Goals - 04/26/16 1526      PT SHORT TERM GOAL #1   Title =LTG's           PT Long Term Goals - 05/07/16 1631      PT LONG TERM GOAL #1   Title Pt will be independent with cervical and postural HEP in order to indicate improved functional mobility and decreased pain.  (Updated Target Date: 04/12/16)   Baseline therapy continues to progress exercises.    Time 6   Period Weeks   Status On-going     PT LONG TERM GOAL #2   Title Pt will report no more than 3/10 pain with all functional mobility in order to indicate pain is decreased limiting factor.  (Updated Date: 04/13/16)   Baseline reports average 4/10 on a daily basis 03/16/16   Time 6   Period Weeks   Status On-going     PT LONG TERM GOAL #3   Title Will assess NDI and  improve by 20% in order to indicate improved self reported pain with functional mobility.  (Updated Target Date: 04/12/16)   Baseline 46% on 03/16/16   Time 4   Period Weeks   Status On-going     PT LONG TERM GOAL #4   Title Patient will demsotrate full cervical extension without increased pain    Baseline 30 deg ext on 03/16/16   Time 4   Period Weeks   Status On-going     PT LONG TERM GOAL #5   Title Pt will demsotrate full cervical flexion without increased pain    Baseline 50 deg flex on 03/16/16   Time 4   Period Weeks   Status On-going     PT LONG TERM GOAL #6   Title Pt will increase R cervical rotation to 60 deg and L cervical rotation to 50 deg in order to indicate improved safety with driving.  (Target Date: 04/12/16)   Baseline full rtoation but continues to have pain    Time 4   Period Weeks   Status On-going               Plan - 05/14/16 1236    Clinical Impression Statement Mulligan mobilizations to cervical spine performed by PT. He reports improved pain with cervical AROM however has increased pain after prolonged positions.  Progressing toward ROM and pain goails.    PT Next Visit Plan Continue with IASTYM, , . Continue with functional strengthening. Continue mobs/ROM exercises    PT Home Exercise Plan supine D2 flexio: Supine ER red, supine Horizontal abduction    Consulted and Agree with Plan of Care Patient      Patient will benefit from skilled therapeutic intervention in order to improve the following deficits and impairments:  Decreased mobility, Decreased range of motion, Decreased strength, Increased muscle spasms, Improper body mechanics, Postural dysfunction, Pain  Visit Diagnosis: Cervicalgia  Abnormal posture     Problem List There are no active problems to display for this patient.   Dorene Ar, [place] 05/14/2016, 12:40 PM  [place], [place], [address] Phone: [phone]   Fax:  [phone]  Name: Wofford Billock MRN: [id number] Date of Birth: 09-08-1970

## 2016-05-16 ENCOUNTER — Ambulatory Visit: Payer: Self-pay | Admitting: Physical Therapy

## 2016-05-16 ENCOUNTER — Encounter: Payer: Self-pay | Admitting: Physical Therapy

## 2016-05-16 DIAGNOSIS — M542 Cervicalgia: Secondary | ICD-10-CM

## 2016-05-16 DIAGNOSIS — R293 Abnormal posture: Secondary | ICD-10-CM

## 2016-05-16 NOTE — Patient Instructions (Signed)
[name] 6 x 6 Exercise Program (5)  This exercise program by [name] addresses postural relationships with the head to neck, neck to shoulders and lower jaw to upper jaw. The objective of this home exercise program is for patients to: learn a new postural position, fight the soft tissue memory of the old position, restore the original muscle length-tension relationships, restore normal joint mobility and restore normal body balance. [name] advocates the instruction of six fundamental components of activity for treatment of TMJ dysfunction. He recommends that patients complete each activity 6x/session and 6x/day.  The activities are as follows: 1) Rest position of the tongue a) Make a clucking sound with the tongue x 6 b) Find normal resting position = holding one third of tongue gently against the roof of the mouth just behind the front teeth c) Diaphragmatically breathe through nose while tongue is in resting position x 6 breaths 2) Control TMJ Rotation on Opening - tongue on roof of mouth and open x 6 reps 3) Mandibular Rhythmic Stabilization - apply light resistance to opening, closing, and lateral deviation with the jaw in a resting position holding for 6 seconds (this is key when a patient has instability as this assists with visualization/neuromuscular reeducation) 4) Stabilized Head Flexion = upper cervical flexion (nodding) - facilitate upper cervical flexion as most of these patients have forward head posture resulting in upper cervical extension deviation. Nod head x 15 degrees back and forth 6 x reps.  5) Lower Cervical Retraction - chin tuck x 6 second hold 6) Shoulder Girdle Retraction - pull shoulders back and down - hold x 6 seconds

## 2016-05-16 NOTE — Therapy (Signed)
[place], [place], [address] Phone: [phone]   Fax:  [phone]  Physical Therapy Treatment  Patient Details  Name: Camry Faler MRN: [id number] Date of Birth: 14-Aug-1970 Referring Provider: Andrey Spearman, MD  Encounter Date: 05/16/2016      PT End of Session - 05/16/16 1314    Visit Number 19   Number of Visits 29   Date for PT Re-Evaluation 06/18/16   Authorization Type self pay   PT Start Time [id number]   PT Stop Time 1228   PT Time Calculation (min) 43 min   Activity Tolerance Patient tolerated treatment well   Behavior During Therapy [facility] for tasks assessed/performed      History reviewed. No pertinent past medical history.  Past Surgical History:  Procedure Laterality Date  . HAND SURGERY Right 1997   Pins placed    There were no vitals filed for this visit.      Subjective Assessment - 05/16/16 1149    Subjective Patient reports that his main problem that he is having now is pain when he is looking on his phone. He is having less pain at work. He feels like he is having some clicking in his jaw. At times he    Limitations Walking;Standing   How long can you sit comfortably? No limit    Patient Stated Goals "I want this thing on my neck to go away."     Currently in Pain? Yes   Pain Location Neck   Pain Orientation Right   Pain Descriptors / Indicators Pressure   Pain Type Acute pain   Pain Radiating Towards pain into his temples    Pain Onset More than a month ago   Pain Frequency Constant   Aggravating Factors  looking down    Pain Relieving Factors rest, heat    Multiple Pain Sites No                         [facility] Adult PT Treatment/Exercise - 05/16/16 0001      Neck Exercises: Machines for Strengthening   UBE (Upper Arm Bike) UBE level 2.5 2 min forward, 2 min backward      Neck Exercises: Standing   Other Standing Exercises press back into ball  keeping neck neutral 2x10     Neck Exercises: Seated   Other Seated Exercise chin tuck 2x10 increased symptoms after 8    Other Seated Exercise reviewed roccabado 6x6 exercises.      Manual Therapy   Manual therapy comments for decreased pain and muscle tightness. IASTM to right upper tram and right cervical praspinals in order to facilitate healing and break up any scar tissue that has formed. Mulligan flexion mobilization for flexion, extension and right rotation performed by Carolyne Littles, PT                 PT Education - 05/16/16 1309    Education provided Yes   Education Details Patient givensheet that explained the amount of pressure put on the neck 2nd to cell phones. Reviewed Roccabado exercises with the patient.    Person(s) Educated Patient   Methods Explanation   Comprehension Verbalized understanding;Returned demonstration;Need further instruction;Verbal cues required          PT Short Term Goals - 04/26/16 1526      PT SHORT TERM GOAL #1   Title =LTG's           PT  Long Term Goals - 05/16/16 1315      PT LONG TERM GOAL #1   Title Pt will be independent with cervical and postural HEP in order to indicate improved functional mobility and decreased pain.  (Updated Target Date: 04/12/16)   Baseline therapy continues to progress exercises.    Time 6   Period Weeks   Status On-going     PT LONG TERM GOAL #2   Title Pt will report no more than 3/10 pain with all functional mobility in order to indicate pain is decreased limiting factor.  (Updated Date: 04/13/16)   Baseline reports average 4/10 on a daily basis 03/16/16   Time 6   Period Weeks   Status On-going     PT LONG TERM GOAL #3   Title Will assess NDI and improve by 20% in order to indicate improved self reported pain with functional mobility.  (Updated Target Date: 04/12/16)   Baseline 46% on 03/16/16   Time 4   Period Weeks   Status On-going     PT LONG TERM GOAL #4   Title Patient will  demsotrate full cervical extension without increased pain    Baseline 30 deg ext on 03/16/16   Time 4   Period Weeks   Status On-going     PT LONG TERM GOAL #5   Title Pt will demsotrate full cervical flexion without increased pain    Baseline 50 deg flex on 03/16/16   Time 4   Period Weeks   Status On-going     PT LONG TERM GOAL #6   Title Pt will increase R cervical rotation to 60 deg and L cervical rotation to 50 deg in order to indicate improved safety with driving.  (Target Date: 04/12/16)   Baseline full rtoation but continues to have pain    Time 4   Period Weeks   Status On-going               Plan - 05/16/16 1320    Clinical Impression Statement Mulligans continue to improve his moviement. Therapy focused on paraspinal strengthening and also reviewed the amount of stress on his neck when using the phone. 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 Therapy reviewed roccabados but left out cervical flexion as this causes hi,m    Rehab Potential Good   Clinical Impairments Affecting Rehab Potential unsure of grade of muscle tear   PT Frequency 2x / week   PT Duration 8 weeks   PT Next Visit Plan Continue with IASTYM, , . Continue with functional strengthening. Continue mobs/ROM exercises. Assess tolerance to last visit. May have been sore.       Patient will benefit from skilled therapeutic intervention in order to improve the following deficits and impairments:  Decreased mobility, Decreased range of motion, Decreased strength, Increased muscle spasms, Improper body mechanics, Postural dysfunction, Pain  Visit Diagnosis: Cervicalgia  Abnormal posture     Problem List There are no active problems to display for this patient.   Carney Living PT DPT  05/16/2016, 1:22 PM  [facility] [address] [place], [place], [address] Phone: [phone]   Fax:  [phone]  Name: Behrett Ariaz MRN:  [id number] Date of Birth: 1971-02-15

## 2016-05-21 ENCOUNTER — Ambulatory Visit: Payer: Self-pay | Admitting: Physical Therapy

## 2016-05-23 ENCOUNTER — Encounter: Payer: Self-pay | Admitting: Physical Therapy

## 2016-05-23 ENCOUNTER — Ambulatory Visit: Payer: Self-pay | Attending: Diagnostic Neuroimaging | Admitting: Physical Therapy

## 2016-05-23 DIAGNOSIS — M542 Cervicalgia: Secondary | ICD-10-CM | POA: Insufficient documentation

## 2016-05-23 DIAGNOSIS — R293 Abnormal posture: Secondary | ICD-10-CM | POA: Insufficient documentation

## 2016-05-23 NOTE — Therapy (Signed)
[place], [place], [address] Phone: [phone]   Fax:  [phone]  Physical Therapy Treatment  Patient Details  Name: Brendan Holmes MRN: [id number] Date of Birth: 12-08-1970 Referring Provider: Andrey Spearman, MD  Encounter Date: 05/23/2016      PT End of Session - 05/23/16 1346    Visit Number 20   Number of Visits 29   Date for PT Re-Evaluation 06/18/16   Authorization Type self pay   PT Start Time 1146   PT Stop Time 1227   PT Time Calculation (min) 41 min   Activity Tolerance Patient tolerated treatment well   Behavior During Therapy [facility] for tasks assessed/performed      History reviewed. No pertinent past medical history.  Past Surgical History:  Procedure Laterality Date  . HAND SURGERY Right 1997   Pins placed    There were no vitals filed for this visit.      Subjective Assessment - 05/23/16 1341    Subjective Patient has had pain over the last 2 days. The pain radiates up into his temples. It only seems to last for a day or 2 but when he has the pain it is tought to deal with. Patient continues to have pain with movement of his neck.    Limitations Walking;Standing   How long can you sit comfortably? No limit    Patient Stated Goals "I want this thing on my neck to go away."     Currently in Pain? Yes   Pain Score 5    Pain Location --  into the temples    Pain Orientation Right   Pain Descriptors / Indicators Aching;Pressure   Pain Type Acute pain   Pain Radiating Towards pain into the temples    Pain Onset More than a month ago   Pain Frequency Constant   Aggravating Factors  looking down    Pain Relieving Factors rest, heat    Effect of Pain on Daily Activities difficulty looking at his phone    Multiple Pain Sites No                         [facility] Adult PT Treatment/Exercise - 05/23/16 0001      Neck Exercises: Machines for Strengthening   Cybex Row 10x2 cues for posture/ scapula retraction, #45   Power Tower Lat pulldown 2x10 45#     Neck Exercises: Standing   Other Standing Exercises press back into ball keeping neck neutral 2x10; shoulder flexion 3lb 2x10      Neck Exercises: Seated   Other Seated Exercise chin tuck 2x10 increased symptoms after 8      Manual Therapy   Manual therapy comments for decreased pain and muscle tightness. IASTM to right upper tram and right cervical praspinals in order to facilitate healing and break up any scar tissue that has formed. Mulligan flexion mobilization for flexion, extension and right rotation performed by Carolyne Littles, PT                 PT Education - 05/23/16 1344    Education provided Yes   Education Details continue with retractions    Person(s) Educated Patient   Methods Explanation   Comprehension Verbalized understanding;Returned demonstration;Verbal cues required;Need further instruction          PT Short Term Goals - 04/26/16 1526      PT SHORT TERM GOAL #1   Title =LTG's  PT Long Term Goals - 05/23/16 1533      PT LONG TERM GOAL #1   Title Pt will be independent with cervical and postural HEP in order to indicate improved functional mobility and decreased pain.  (Updated Target Date: 04/12/16)   Baseline therapy continues to progress exercises.    Time 6   Period Weeks   Status Achieved     PT LONG TERM GOAL #2   Title Pt will report no more than 3/10 pain with all functional mobility in order to indicate pain is decreased limiting factor.  (Updated Date: 04/13/16)   Baseline some days without pain some days with pain to 4-5/10    Time 6   Period Weeks   Status On-going     PT LONG TERM GOAL #3   Title Will assess NDI and improve by 20% in order to indicate improved self reported pain with functional mobility.  (Updated Target Date: 04/12/16)   Baseline 46% on 03/16/16   Time 4   Period Weeks     PT LONG TERM GOAL #4    Title Patient will demsotrate full cervical extension without increased pain    Baseline improved with mulligans    Time 4   Period Weeks   Status On-going     PT LONG TERM GOAL #5   Title Pt will demsotrate full cervical flexion without increased pain    Baseline 50 deg flex on 03/16/16   Time 4   Period Weeks   Status On-going     PT LONG TERM GOAL #6   Title Pt will increase R cervical rotation to 60 deg and L cervical rotation to 50 deg in order to indicate improved safety with driving.  (Target Date: 04/12/16)   Baseline full rtoation but continues to have pain    Time 4   Period Weeks   Status On-going               Plan - 05/23/16 1347    Clinical Impression Statement Patient continues to have a releapse of syymptoms. He has several good days in a row. Patient has appontments for the next few weeks. He feels like he is making some progress but therapy advised he return to his MD.    Rehab Potential Good   Clinical Impairments Affecting Rehab Potential unsure of grade of muscle tear   PT Frequency 2x / week   PT Duration 8 weeks   PT Treatment/Interventions ADLs/Self Care Home Management;Electrical Stimulation;Moist Heat;Ultrasound;Neuromuscular re-education;Patient/family education;Manual techniques;Taping   PT Next Visit Plan Continue with IASTYM, , . Continue with functional strengthening. Continue mobs/ROM exercises. Assess tolerance to last visit. May have been sore.    PT Home Exercise Plan supine D2 flexio: Supine ER red, supine Horizontal abduction    Consulted and Agree with Plan of Care Patient      Patient will benefit from skilled therapeutic intervention in order to improve the following deficits and impairments:  Decreased mobility, Decreased range of motion, Decreased strength, Increased muscle spasms, Improper body mechanics, Postural dysfunction, Pain  Visit Diagnosis: Cervicalgia  Abnormal posture     Problem List There are no active  problems to display for this patient.   Carney Living PT DPT  05/23/2016, 3:38 PM  [facility] [address] [place], [place], [address] Phone: [phone]   Fax:  [phone]  Name: Brendan Holmes MRN: [id number] Date of Birth: June 29, 1970

## 2016-05-28 ENCOUNTER — Ambulatory Visit: Payer: Self-pay | Admitting: Physical Therapy

## 2016-05-28 ENCOUNTER — Encounter: Payer: Self-pay | Admitting: Physical Therapy

## 2016-05-28 DIAGNOSIS — R293 Abnormal posture: Secondary | ICD-10-CM

## 2016-05-28 DIAGNOSIS — M542 Cervicalgia: Secondary | ICD-10-CM

## 2016-05-28 NOTE — Therapy (Signed)
[place], [place], [address] Phone: [phone]   Fax:  [phone]  Physical Therapy Treatment  Patient Details  Name: Brendan Holmes MRN: [id number] Date of Birth: March 04, 1971 Referring Provider: Andrey Spearman, MD  Encounter Date: 05/28/2016      PT End of Session - 05/28/16 1336    Visit Number 21   Number of Visits 29   Date for PT Re-Evaluation 06/18/16   Authorization Type self pay   PT Start Time [id number]   PT Stop Time 1226   PT Time Calculation (min) 41 min   Activity Tolerance Patient tolerated treatment well   Behavior During Therapy [facility] for tasks assessed/performed      History reviewed. No pertinent past medical history.  Past Surgical History:  Procedure Laterality Date  . HAND SURGERY Right 1997   Pins placed    There were no vitals filed for this visit.      Subjective Assessment - 05/28/16 1331    Subjective Patient reports 2 good days and 2 bad days. He had pain over the weekend. He did a lot of shoveling. He reports it is better today.    Limitations Walking;Standing   How long can you sit comfortably? No limit    Patient Stated Goals "I want this thing on my neck to go away."     Currently in Pain? No/denies                         [facility] Adult PT Treatment/Exercise - 05/28/16 0001      Neck Exercises: Machines for Strengthening   UBE (Upper Arm Bike) 3 min forward 3 back level 2.5      Neck Exercises: Standing   Other Standing Exercises press back into ball keeping neck neutral 2x10; shoulder flexion 3lb 2x10      Neck Exercises: Seated   Other Seated Exercise chin tuck 3x10 increased symptoms after 8      Manual Therapy   Manual therapy comments for decreased pain and muscle tightness. IASTM to right upper tram and right cervical praspinals in order to facilitate healing and break up any scar tissue that has formed. Mulligan flexion  mobilization for flexion, extension and right rotation performed by Carolyne Littles, PT                 PT Education - 05/28/16 1334    Education provided Yes   Education Details continue with stretching and strengthening   Person(s) Educated Patient   Methods Explanation   Comprehension Verbalized understanding;Returned demonstration;Verbal cues required;Need further instruction          PT Short Term Goals - 04/26/16 1526      PT SHORT TERM GOAL #1   Title =LTG's           PT Long Term Goals - 05/23/16 1533      PT LONG TERM GOAL #1   Title Pt will be independent with cervical and postural HEP in order to indicate improved functional mobility and decreased pain.  (Updated Target Date: 04/12/16)   Baseline therapy continues to progress exercises.    Time 6   Period Weeks   Status Achieved     PT LONG TERM GOAL #2   Title Pt will report no more than 3/10 pain with all functional mobility in order to indicate pain is decreased limiting factor.  (Updated Date: 04/13/16)   Baseline some days without pain  some days with pain to 4-5/10    Time 6   Period Weeks   Status On-going     PT LONG TERM GOAL #3   Title Will assess NDI and improve by 20% in order to indicate improved self reported pain with functional mobility.  (Updated Target Date: 04/12/16)   Baseline 46% on 03/16/16   Time 4   Period Weeks     PT LONG TERM GOAL #4   Title Patient will demsotrate full cervical extension without increased pain    Baseline improved with mulligans    Time 4   Period Weeks   Status On-going     PT LONG TERM GOAL #5   Title Pt will demsotrate full cervical flexion without increased pain    Baseline 50 deg flex on 03/16/16   Time 4   Period Weeks   Status On-going     PT LONG TERM GOAL #6   Title Pt will increase R cervical rotation to 60 deg and L cervical rotation to 50 deg in order to indicate improved safety with driving.  (Target Date: 04/12/16)   Baseline full  rtoation but continues to have pain    Time 4   Period Weeks   Status On-going               Plan - 05/28/16 1337    Clinical Impression Statement Paytient had improved rotation with Mullligan mobilizations. At this time he is not showing much carryover with treatment. Therapy continues to focus on neck and postural strengthening and trigger point release.    Clinical Impairments Affecting Rehab Potential unsure of grade of muscle tear   PT Frequency 2x / week   PT Duration 8 weeks   PT Treatment/Interventions ADLs/Self Care Home Management;Electrical Stimulation;Moist Heat;Ultrasound;Neuromuscular re-education;Patient/family education;Manual techniques;Taping   PT Next Visit Plan Continue with IASTYM, , . Continue with functional strengthening. Continue mobs/ROM exercises. Assess tolerance to last visit. May have been sore.    PT Home Exercise Plan supine D2 flexio: Supine ER red, supine Horizontal abduction    Consulted and Agree with Plan of Care Patient      Patient will benefit from skilled therapeutic intervention in order to improve the following deficits and impairments:  Decreased mobility, Decreased range of motion, Decreased strength, Increased muscle spasms, Improper body mechanics, Postural dysfunction, Pain  Visit Diagnosis: Cervicalgia  Abnormal posture     Problem List There are no active problems to display for this patient.   Carney Living 05/28/2016, 1:46 PM  [facility] [address] [place], [place], [address] Phone: [phone]   Fax:  [phone]  Name: Brendan Holmes MRN: [id number] Date of Birth: 02-17-1971

## 2016-05-30 ENCOUNTER — Ambulatory Visit: Payer: Self-pay | Admitting: Physical Therapy

## 2016-05-30 DIAGNOSIS — R293 Abnormal posture: Secondary | ICD-10-CM

## 2016-05-30 DIAGNOSIS — M542 Cervicalgia: Secondary | ICD-10-CM

## 2016-05-30 NOTE — Therapy (Signed)
[place], [place], [address] Phone: [phone]   Fax:  [phone]  Physical Therapy Treatment  Patient Details  Name: Brendan Holmes MRN: [id number] Date of Birth: May 03, 1970 Referring Provider: Andrey Spearman, MD  Encounter Date: 05/30/2016      PT End of Session - 05/30/16 1152    Visit Number 22   Number of Visits 29   Date for PT Re-Evaluation 06/18/16   Authorization Type self pay   PT Start Time [id number]   PT Stop Time 1225   PT Time Calculation (min) 40 min      No past medical history on file.  Past Surgical History:  Procedure Laterality Date  . HAND SURGERY Right 1997   Pins placed    There were no vitals filed for this visit.      Subjective Assessment - 05/30/16 1155    Currently in Pain? Yes   Pain Score 4    Pain Orientation Right   Pain Descriptors / Indicators Pressure   Aggravating Factors  looking down, pulling with right arm, shoveling   Pain Relieving Factors rest heat             [facility] PT Assessment - 05/30/16 0001      Observation/Other Assessments   Neck Disability Index  15= 30%  limited      AROM   Overall AROM Comments 65 rotation each way, 35 cervical sidebend each way, extension 40, flexion 50                     [facility] Adult PT Treatment/Exercise - 05/30/16 0001      Neck Exercises: Machines for Strengthening   UBE (Upper Arm Bike) 3 min forward 3 back level 2.5    Cybex Row --   Power Tower --     Neck Exercises: Theraband   Shoulder Extension 20 reps   Shoulder Extension Limitations blue   Rows 20 reps   Rows Limitations blue     Neck Exercises: Standing   Other Standing Exercises press back into ball keeping neck neutral 2x10; shoulder flexion 3lb 2x10      Manual Therapy   Manual therapy comments for decreased pain and muscle tightness. IASTMY to right upper tram and right cervical praspinals in order to facilitate  healing and break up any dscar tissue that has formed.                   PT Short Term Goals - 04/26/16 1526      PT SHORT TERM GOAL #1   Title =LTG's           PT Long Term Goals - 05/30/16 1153      PT LONG TERM GOAL #1   Title Pt will be independent with cervical and postural HEP in order to indicate improved functional mobility and decreased pain.  (Updated Target Date: 04/12/16)   Status Achieved     PT LONG TERM GOAL #2   Title Pt will report no more than 3/10 pain with all functional mobility in order to indicate pain is decreased limiting factor.  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  (Updated Target Date: 04/12/16)   Baseline 46% on 03/16/16, 30% at discharge    Time 4   Period Weeks   Status Not Met     PT LONG TERM GOAL #4   Title Patient will demsotrate full cervical extension without increased pain    Baseline improved with mulligans but still painful    Time 4   Period Weeks   Status Partially Met     PT LONG TERM GOAL #5   Title Pt will demsotrate full cervical flexion without increased pain    Status Partially Met     PT LONG TERM GOAL #6   Title Pt will increase R cervical rotation to 60 deg and L cervical rotation to 50 deg in order to indicate improved safety with driving.  (Target Date: 04/12/16)   Baseline full rtoation but continues to have pain    Status Partially Met               Plan - 05/30/16 1320    Clinical Impression Statement Pt reports IASTYM and mulligan mobilization have been most helpful. He continues to focus push ups and sit ups at home and may benefit from reinforced  scapular stabilzation exercises and their benefit at discharge  next visit. Some goals oartially met. See Goals.    PT Next Visit Plan discharge next visit.   PT Home Exercise Plan supine D2 flexio: Supine ER red, supine Horizontal abduction       Patient will benefit from skilled therapeutic intervention in order to improve the following deficits and impairments:  Decreased mobility, Decreased range of motion, Decreased strength, Increased muscle spasms, Improper body mechanics, Postural dysfunction, Pain  Visit Diagnosis: Abnormal posture  Cervicalgia     Problem List There are no active problems to display for this patient.   Dorene Ar, [place] 05/30/2016, 1:29 PM  [place], [place], [address] Phone: [phone]   Fax:  [phone]  Name: Ranson Belluomini MRN: [id number] Date of Birth: 23-Nov-1970

## 2016-06-04 ENCOUNTER — Encounter: Payer: Self-pay | Admitting: Physical Therapy

## 2016-06-04 ENCOUNTER — Ambulatory Visit: Payer: Self-pay | Admitting: Physical Therapy

## 2016-06-04 DIAGNOSIS — M542 Cervicalgia: Secondary | ICD-10-CM

## 2016-06-04 DIAGNOSIS — R293 Abnormal posture: Secondary | ICD-10-CM

## 2016-06-04 NOTE — Therapy (Signed)
[place], [place], [address] Phone: [phone]   Fax:  [phone]  Physical Therapy Treatment/ Discharge   Patient Details  Name: Brendan Holmes MRN: [id number] Date of Birth: 1970/07/25 Referring Provider: Andrey Spearman, MD  Encounter Date: 06/04/2016      PT End of Session - 06/04/16 1243    Visit Number 23   Number of Visits 29   Date for PT Re-Evaluation 06/18/16   Authorization Type self pay   PT Start Time 2482   PT Stop Time 1223   PT Time Calculation (min) 38 min   Activity Tolerance Patient tolerated treatment well   Behavior During Therapy [facility] for tasks assessed/performed      History reviewed. No pertinent past medical history.  Past Surgical History:  Procedure Laterality Date  . HAND SURGERY Right 1997   Pins placed    There were no vitals filed for this visit.      Subjective Assessment - 06/04/16 1238    Limitations Walking;Standing   How long can you sit comfortably? No limit    Patient Stated Goals "I want this thing on my neck to go away."                           [facility] Adult PT Treatment/Exercise - 06/04/16 0001      Manual Therapy   Manual therapy comments for decreased pain and muscle tightness. IASTM to right upper tram and right cervical praspinals in order to facilitate healing and break up any scar tissue that has formed. Mulligan flexion mobilization for flexion, extension and right rotation / left rotation.. Reviewed Self mobilization techniques.                 PT Education - 06/04/16 1239    Education provided Yes   Education Details continue with stretching and strengthening    Person(s) Educated Patient   Methods Explanation   Comprehension Verbalized understanding          PT Short Term Goals - 04/26/16 1526      PT SHORT TERM GOAL #1   Title =LTG's           PT Long Term Goals - 06/04/16 1245      PT  LONG TERM GOAL #1   Title Pt will be independent with cervical and postural HEP in order to indicate improved functional mobility and decreased pain.  (Updated Target Date: 04/12/16)   Baseline therapy continues to progress exercises.    Time 6   Period Weeks   Status Achieved     PT LONG TERM GOAL #2   Title Pt will report no more than 3/10 pain with all functional mobility in order to indicate pain is decreased limiting factor.  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  (Updated Target Date: 04/12/16)   Baseline 46% on 03/16/16, 30% at discharge    Status Not Met     PT LONG TERM GOAL #4   Title Patient will demsotrate full cervical extension without increased pain    Baseline improved with  mulligans but still painful    Time 4   Period Weeks   Status On-going     PT LONG TERM GOAL #5   Title Pt will demsotrate full cervical flexion without increased pain    Baseline 50 deg flex on 03/16/16   Time 4   Period Weeks   Status On-going     PT LONG TERM GOAL #6   Title Pt will increase R cervical rotation to 60 deg and L cervical rotation to 50 deg in order to indicate improved safety with driving.  (Target Date: 04/12/16)   Baseline full rtoation but continues to have pain    Time 4   Period Weeks               Plan - 06/04/16 1243    Clinical Impression Statement Patient has paltued in progress. He has improved symptoms with Mulligan mobilizations but the relief only lasts a few days. He is ablt to perfrom high level exercises without difficulty. Vibration from his truck or machines seem to give him the biggest difficultys. He has a full EP at home. Therapy reviewed Mulligan self snags. He will be  discharged home to an HEP.    Rehab Potential Good   Clinical Impairments Affecting Rehab Potential unsure of grade of muscle tear   PT Frequency 2x / week   PT Duration 8 weeks   PT Treatment/Interventions ADLs/Self Care Home Management;Electrical Stimulation;Moist Heat;Ultrasound;Neuromuscular re-education;Patient/family education;Manual techniques;Taping   PT Next Visit Plan D/C to HEP    PT Home Exercise Plan supine D2 flexio: Supine ER red, supine Horizontal abduction    Consulted and Agree with Plan of Care Patient      Patient will benefit from skilled therapeutic intervention in order to improve the following deficits and impairments:  Decreased mobility, Decreased range of motion, Decreased strength, Increased muscle spasms, Improper body mechanics, Postural dysfunction, Pain  Visit Diagnosis: Abnormal posture  Cervicalgia    PHYSICAL THERAPY DISCHARGE SUMMARY  Visits from Start of Care: 23  Current functional level related to goals / functional outcomes: Continues to have pain at times but the pain goes away    Remaining deficits: Pain when looking down for periods of time   Education / Equipment: HEP Plan: Patient agrees to discharge.  Patient goals were not met. Patient is being discharged due to lack of progress.  ?????      Problem List There are no active problems to display for this patient.   Carney Living PT DPT  06/04/2016, 1:05 PM  [facility] [address] [place], [place], [address] Phone: [phone]   Fax:  [phone]  Name: Brendan Holmes MRN: [id number] Date of Birth: [birth year]

## 2016-06-06 ENCOUNTER — Ambulatory Visit: Payer: Self-pay | Admitting: Physical Therapy

## 2016-06-11 ENCOUNTER — Encounter: Payer: Self-pay | Admitting: Diagnostic Neuroimaging

## 2016-06-11 ENCOUNTER — Ambulatory Visit (INDEPENDENT_AMBULATORY_CARE_PROVIDER_SITE_OTHER): Payer: PRIVATE HEALTH INSURANCE | Admitting: Diagnostic Neuroimaging

## 2016-06-11 VITALS — BP 147/84 | HR 88 | Wt 165.0 lb

## 2016-06-11 DIAGNOSIS — G44309 Post-traumatic headache, unspecified, not intractable: Secondary | ICD-10-CM | POA: Diagnosis not present

## 2016-06-11 DIAGNOSIS — G4489 Other headache syndrome: Secondary | ICD-10-CM | POA: Diagnosis not present

## 2016-06-11 DIAGNOSIS — M542 Cervicalgia: Secondary | ICD-10-CM
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
## 2016-06-11 NOTE — Patient Instructions (Addendum)
Thank you for coming to see [place] at [facility]. I hope we have been able to provide you high quality care today.  You may receive a patient satisfaction survey over the next few weeks. We would appreciate your feedback and comments so that we may continue to improve ourselves and the health of our patients.   - repeat CT cervical spine  - conservative mgmt; continue physical therapy exercises  - ice, rest, moderate physical activity  - consider amitriptyline or cymbalta for nerve pain, sleep problems, anxiety    ~~~~~~~~~~~~~~~~~~~~~~~~~~~~~~~~~~~~~~~~~~~~~~~~~~~~~~~~~~~~~~~~~  DR. PENUMALLI'S GUIDE TO HAPPY AND HEALTHY LIVING These are some of my general health and wellness recommendations. Some of them may apply to you better than others. Please use common sense as you try these suggestions and feel free to ask me any questions.   ACTIVITY/FITNESS Mental, social, emotional and physical stimulation are very important for brain and body health. Try learning a new activity (arts, music, language, sports, games).  Keep moving your body to the best of your abilities. You can do this at home, inside or outside, the park, community center, gym or anywhere you like. Consider a physical therapist or personal trainer to get started. Consider the app Sworkit. Fitness trackers such as smart-watches, smart-phones or Fitbits can help as well.   NUTRITION Eat more plants: colorful vegetables, nuts, seeds and berries.  Eat less sugar, salt, preservatives and processed foods.  Avoid toxins such as cigarettes and alcohol.  Drink water when you are thirsty. Warm water with a slice of lemon is an excellent morning drink to start the day.  Consider these websites for more information The Nutrition Source (https://www.henry-hernandez.biz/) Precision Nutrition (WindowBlog.ch)   RELAXATION Consider practicing mindfulness meditation or  other relaxation techniques such as deep breathing, prayer, yoga, tai chi, massage. See website mindful.org or the apps Headspace or Calm to help get started.   SLEEP Try to get at least 7-8+ hours sleep per day. Regular exercise and reduced caffeine will help you sleep better. Practice good sleep hygeine techniques. See website sleep.org for more information.   PLANNING Prepare estate planning, living will, healthcare POA documents. Sometimes this is best planned with the help of an attorney. Theconversationproject.org and agingwithdignity.org are excellent resources.

## 2016-07-05 ENCOUNTER — Ambulatory Visit
Admission: RE | Admit: 2016-07-05 | Discharge: 2016-07-05 | Disposition: A | Discharge status: Home / Self Care | Payer: PRIVATE HEALTH INSURANCE | Source: Ambulatory Visit | Attending: Diagnostic Neuroimaging | Admitting: Diagnostic Neuroimaging

## 2016-07-05 ENCOUNTER — Telehealth: Payer: Self-pay | Admitting: Diagnostic Neuroimaging

## 2016-07-05 DIAGNOSIS — S46811S Strain of other muscles, fascia and tendons at shoulder and upper arm level, right arm, sequela: Secondary | ICD-10-CM

## 2016-07-05 DIAGNOSIS — G4489 Other headache syndrome: Secondary | ICD-10-CM

## 2016-07-05 DIAGNOSIS — G44309 Post-traumatic headache, unspecified, not intractable: Secondary | ICD-10-CM

## 2016-07-05 DIAGNOSIS — M542 Cervicalgia: Secondary | ICD-10-CM

## 2016-07-05 DIAGNOSIS — T148XXA Other injury of unspecified body region, initial encounter: Secondary | ICD-10-CM

## 2016-07-05 NOTE — Telephone Encounter (Signed)
I called patient no answer.

## 2016-07-05 NOTE — Telephone Encounter (Signed)
I called patient. Right trapezius muscle tear still persistent, in spite of conservative mgmt for > 6 months. I will refer to orthopedic surgery to eval and treat right trapezius muscle tear. -VRP

## 2016-08-06 ENCOUNTER — Telehealth: Payer: Self-pay | Admitting: Diagnostic Neuroimaging

## 2016-08-06 NOTE — Telephone Encounter (Addendum)
I spoke to pt.  He stated was seen by PMR Dr.Wang at [facility].  This is who he was placed with, nothing that could be done by them. (Looked at scans Sent back to you, refer to [facility]?  Pt also c/o abdominal pain for the last 2 mon.  Asking for referral to GI,  I told him that we did not assess for this problem, would see pcp initially and they would referr to GI if needed.  Pt verbalized understanding.

## 2016-08-06 NOTE — Telephone Encounter (Signed)
error 

## 2016-08-06 NOTE — Telephone Encounter (Signed)
Pt called said he was referred back to Dr Mamie Nick by Dr Jacelyn Grip. He was told no one there could help him.  Pt said he is still having pain in his stomach, he said Dr Mamie Nick advised he may need to see Gastroenterologist. Please call

## 2016-08-06 NOTE — Telephone Encounter (Signed)
Please request notes from [facility]. -VRP

## 2016-08-06 NOTE — Telephone Encounter (Signed)
LMVM for MR that requesting notes on this pts ofv visit.  Left my # and fax #.

## 2016-08-08 NOTE — Telephone Encounter (Signed)
Called and LMVM for MR - Brendan Holmes? Relating to getting ofv note from pts visit (referral from [place]).

## 2016-08-08 NOTE — Telephone Encounter (Signed)
Received notes. To Dr. Gladstone Lighter desk.

## 2016-08-09 ENCOUNTER — Other Ambulatory Visit: Payer: Self-pay | Admitting: Gastroenterology

## 2016-08-09 DIAGNOSIS — R110 Nausea: Secondary | ICD-10-CM

## 2016-08-09 DIAGNOSIS — R1013 Epigastric pain: Secondary | ICD-10-CM

## 2016-08-10 ENCOUNTER — Ambulatory Visit (HOSPITAL_COMMUNITY)
Admission: RE | Admit: 2016-08-10 | Discharge: 2016-08-10 | Disposition: A | Discharge status: Home / Self Care | Payer: PRIVATE HEALTH INSURANCE | Source: Ambulatory Visit | Attending: Gastroenterology | Admitting: Gastroenterology

## 2016-08-10 ENCOUNTER — Telehealth: Payer: Self-pay | Admitting: Diagnostic Neuroimaging

## 2016-08-10 DIAGNOSIS — R1013 Epigastric pain: Secondary | ICD-10-CM | POA: Insufficient documentation

## 2016-08-10 DIAGNOSIS — R110 Nausea: Secondary | ICD-10-CM | POA: Insufficient documentation

## 2016-08-10 DIAGNOSIS — D1803 Hemangioma of intra-abdominal structures: Secondary | ICD-10-CM | POA: Insufficient documentation

## 2016-08-10 DIAGNOSIS — M542 Cervicalgia: Secondary | ICD-10-CM

## 2016-08-10 NOTE — Telephone Encounter (Signed)
Spoke with pt and relayed below.  Would like to see someone else locally.  Made referral to [facility].

## 2016-08-10 NOTE — Telephone Encounter (Signed)
Patient called office in reference to our office referring him to [facility] patient went to the appointment and their clinic advised patient they could not treat him and would like to know where he can be referred to now.  Please call

## 2016-08-10 NOTE — Telephone Encounter (Signed)
Notes reviewed. Apparently there are limited treatment options due to inability to get MRI for better evaluation.   I can refer to another orthopedic or pain clinic if patient requests ([facility] or [facility]).    -VRP

## 2016-08-10 NOTE — Telephone Encounter (Signed)
Received [facility] notes 08-08-16, on Dr. Gladstone Lighter desk.

## 2016-08-10 NOTE — Addendum Note (Signed)
Addended byOliver Hum on: 08/10/2016 12:49 PM   Modules accepted: Orders

## 2016-08-14 ENCOUNTER — Ambulatory Visit (HOSPITAL_COMMUNITY)
Admission: RE | Admit: 2016-08-14 | Discharge: 2016-08-14 | Disposition: A | Discharge status: Home / Self Care | Payer: Self-pay | Source: Ambulatory Visit | Attending: Gastroenterology | Admitting: Gastroenterology

## 2016-08-14 ENCOUNTER — Ambulatory Visit (HOSPITAL_COMMUNITY): Payer: PRIVATE HEALTH INSURANCE

## 2016-08-14 DIAGNOSIS — R110 Nausea: Secondary | ICD-10-CM

## 2016-08-14 DIAGNOSIS — R1013 Epigastric pain: Secondary | ICD-10-CM | POA: Insufficient documentation

## 2016-08-14 MED ORDER — TECHNETIUM TC 99M MEBROFENIN IV KIT
5.3500 | PACK | Freq: Once | INTRAVENOUS | Status: AC | PRN
  Administered 2016-08-14: 5.35 via INTRAVENOUS

## 2016-08-17 ENCOUNTER — Other Ambulatory Visit: Payer: Self-pay | Admitting: Gastroenterology

## 2016-08-17 DIAGNOSIS — R110 Nausea: Secondary | ICD-10-CM

## 2016-08-17 DIAGNOSIS — R1011 Right upper quadrant pain: Secondary | ICD-10-CM

## 2016-08-17 DIAGNOSIS — R1013 Epigastric pain: Secondary | ICD-10-CM

## 2016-09-04 ENCOUNTER — Ambulatory Visit
Admission: RE | Admit: 2016-09-04 | Discharge: 2016-09-04 | Disposition: A | Discharge status: Home / Self Care | Payer: PRIVATE HEALTH INSURANCE | Source: Ambulatory Visit | Attending: Gastroenterology | Admitting: Gastroenterology

## 2016-09-04 DIAGNOSIS — R1011 Right upper quadrant pain: Secondary | ICD-10-CM

## 2016-09-04 DIAGNOSIS — R110 Nausea: Secondary | ICD-10-CM

## 2016-09-04 DIAGNOSIS — R1013 Epigastric pain: Secondary | ICD-10-CM

## 2016-10-01 ENCOUNTER — Ambulatory Visit (INDEPENDENT_AMBULATORY_CARE_PROVIDER_SITE_OTHER): Payer: PRIVATE HEALTH INSURANCE | Admitting: Specialist

## 2016-10-01 ENCOUNTER — Encounter (INDEPENDENT_AMBULATORY_CARE_PROVIDER_SITE_OTHER): Payer: Self-pay | Admitting: Specialist

## 2016-10-01 DIAGNOSIS — M4722 Other spondylosis with radiculopathy, cervical region: Secondary | ICD-10-CM

## 2016-10-01 DIAGNOSIS — R51 Headache: Secondary | ICD-10-CM

## 2016-10-01 DIAGNOSIS — M542 Cervicalgia: Secondary | ICD-10-CM | POA: Diagnosis not present

## 2016-10-01 DIAGNOSIS — R519 Headache, unspecified: Secondary | ICD-10-CM

## 2016-10-01 DIAGNOSIS — G9589 Other specified diseases of spinal cord: Secondary | ICD-10-CM

## 2016-10-01 DIAGNOSIS — G959 Disease of spinal cord, unspecified: Secondary | ICD-10-CM

## 2016-10-01 MED ORDER — METHYLPREDNISOLONE 4 MG PO TBPK: ORAL_TABLET | ORAL | 0 refills | Status: AC

## 2016-10-01 MED ORDER — GABAPENTIN 100 MG PO CAPS: 100.0000 mg | ORAL_CAPSULE | Freq: Three times a day (TID) | ORAL | 1 refills | Status: DC

## 2016-10-01 MED ORDER — GABAPENTIN 100 MG PO CAPS: 100.0000 mg | ORAL_CAPSULE | Freq: Three times a day (TID) | ORAL | 1 refills | Status: AC
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
## 2016-10-02 NOTE — Progress Notes (Signed)
Called to [facility]

## 2016-10-22 ENCOUNTER — Encounter (INDEPENDENT_AMBULATORY_CARE_PROVIDER_SITE_OTHER): Payer: Self-pay | Admitting: *Deleted

## 2017-08-06 ENCOUNTER — Telehealth: Payer: Self-pay | Admitting: *Deleted

## 2017-08-06 NOTE — Telephone Encounter (Addendum)
I called pt Brendan Holmes unable to reach pt. 

## 2017-08-07 ENCOUNTER — Telehealth: Payer: Self-pay | Admitting: *Deleted
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
## 2017-08-20 NOTE — Telephone Encounter (Signed)
Dr Leta Baptist has reviewed and will not fill out forms. Forms returned to medical records. Medical records to call patient and inform him. Patient may pick up papers.

## 2018-05-15 IMAGING — US US ABDOMEN COMPLETE
1 series · 14 of 25 positions shown · non-contrast
Comparison: None.

CLINICAL DATA: 46-year-old male with epigastric abdominal pain and
nausea for 4 weeks.

EXAM:
ABDOMEN ULTRASOUND COMPLETE

[Series 1: us abdomen complete · 0.20mm/px · 14 of 94 slices shown]
[im 1/94]
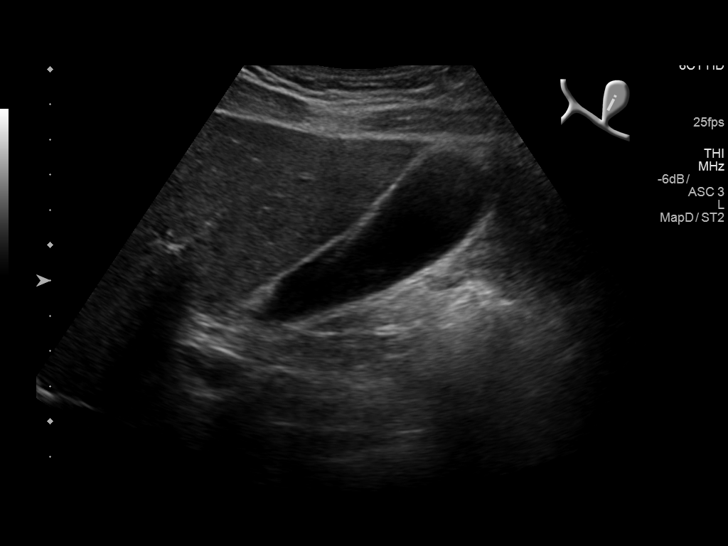
[im 8/94]
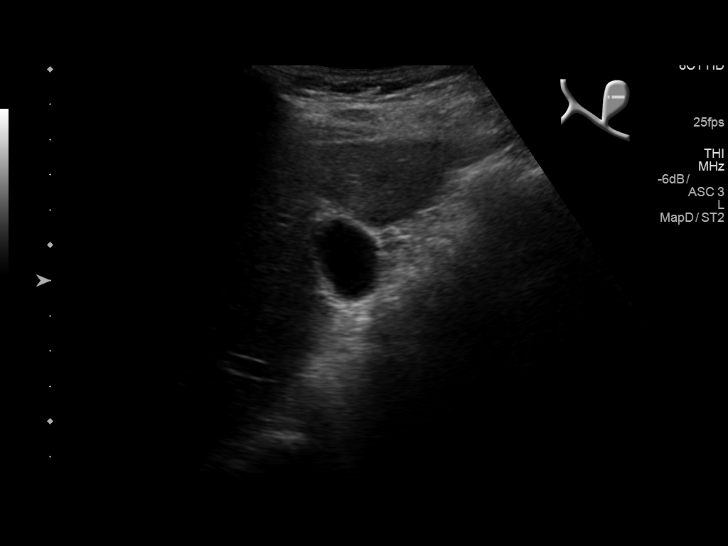
[im 16/94]
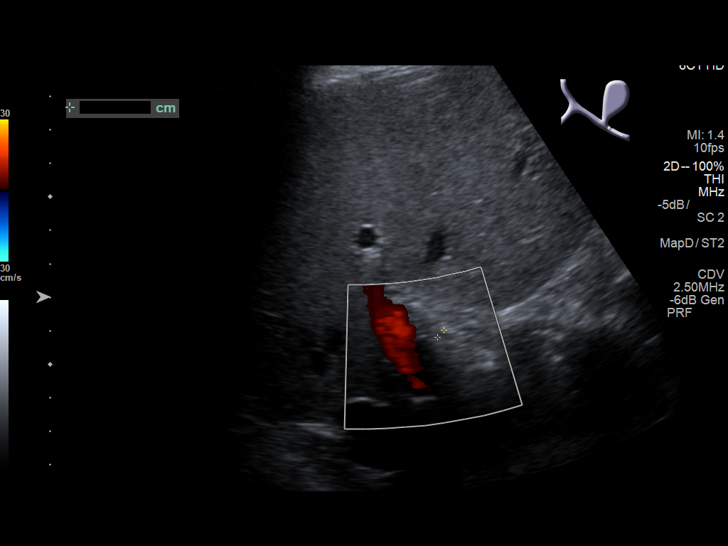
[im 24/94]
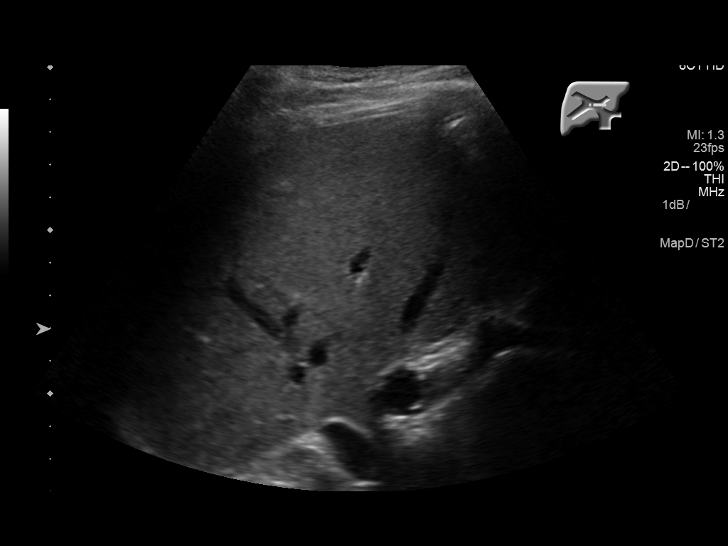
[im 32/94]
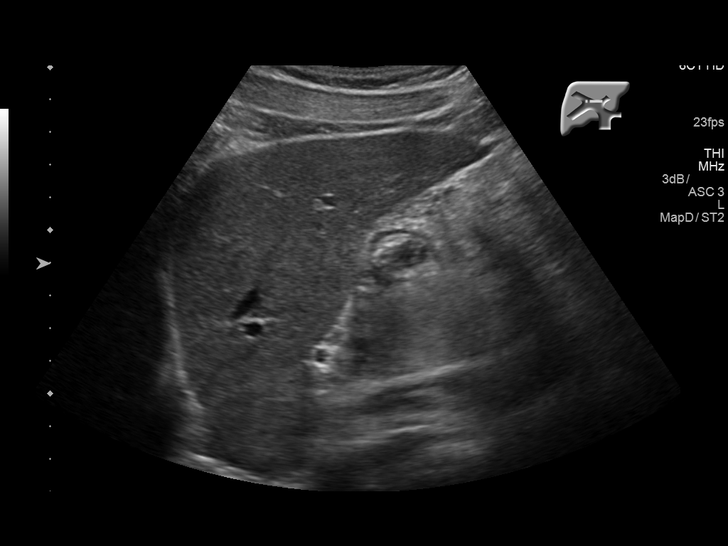
[im 35/94]
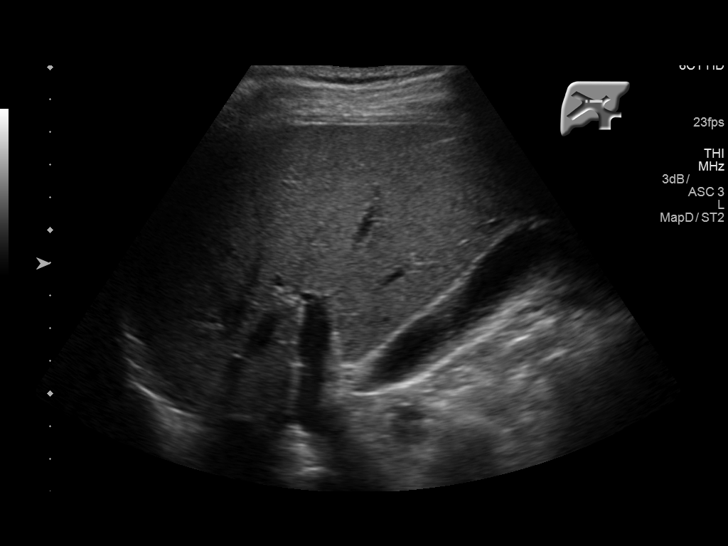
[im 43/94]
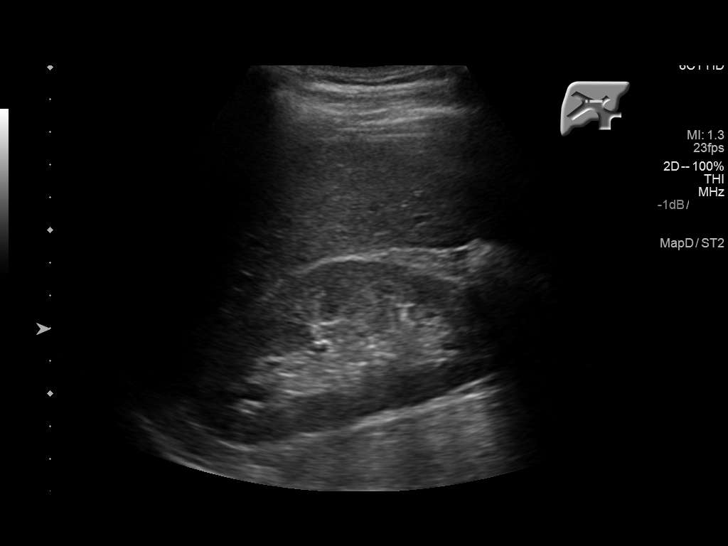
[im 51/94]
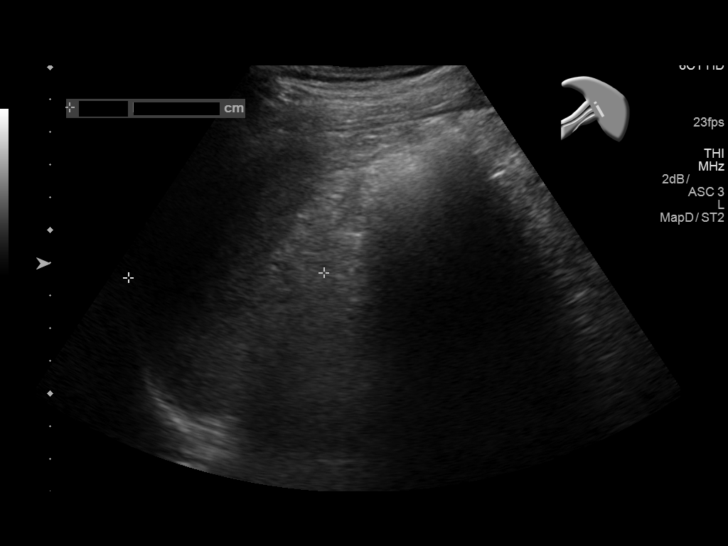
[im 59/94]
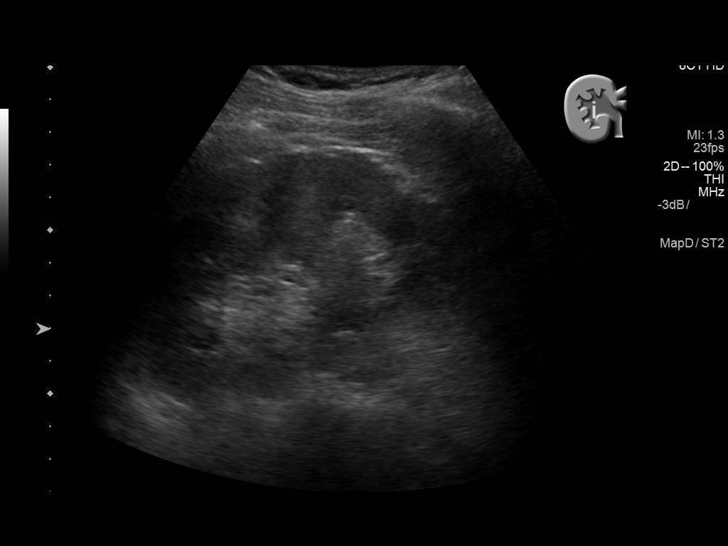
[im 63/94]
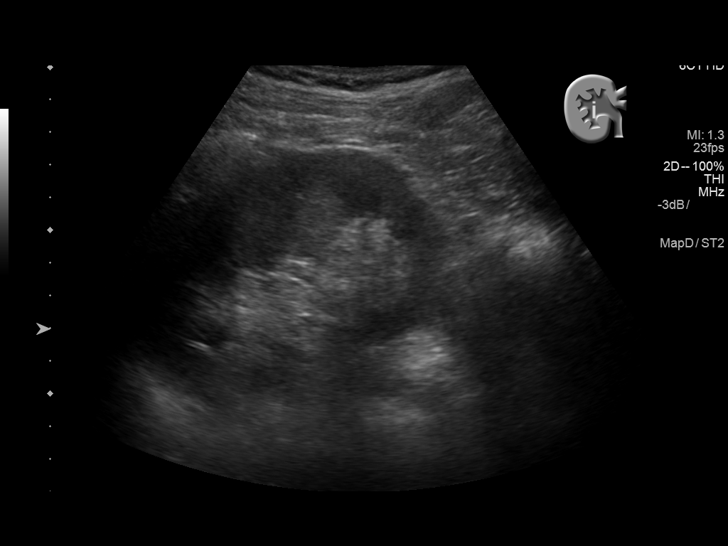
[im 70/94]
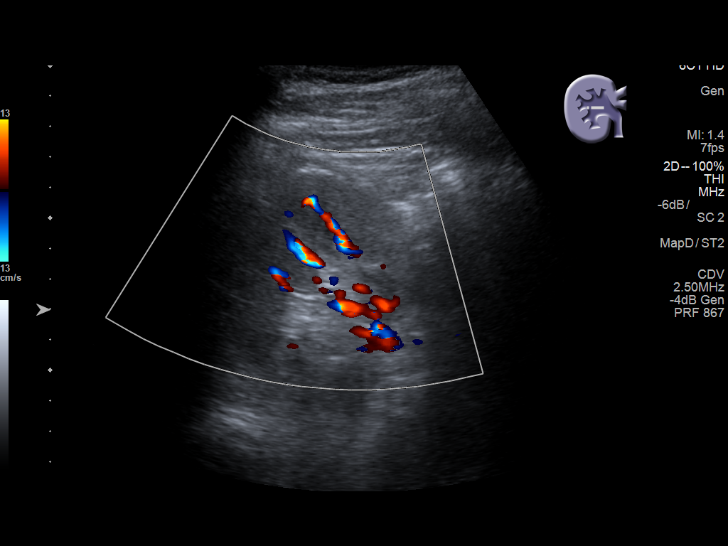
[im 78/94]
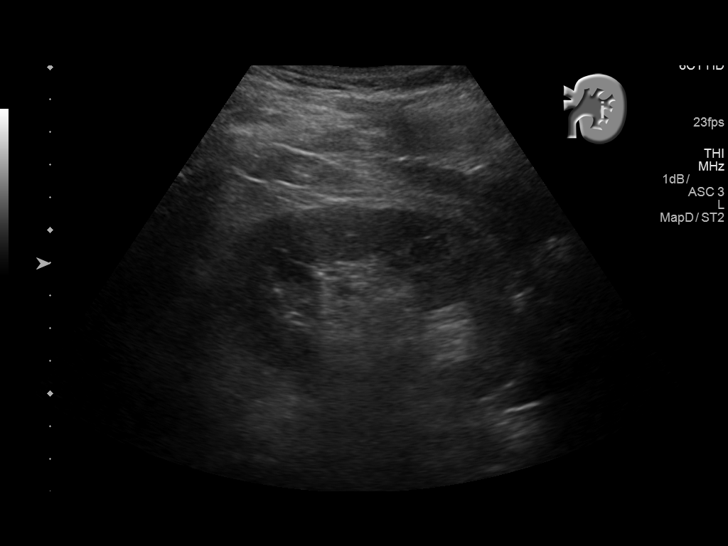
[im 86/94]
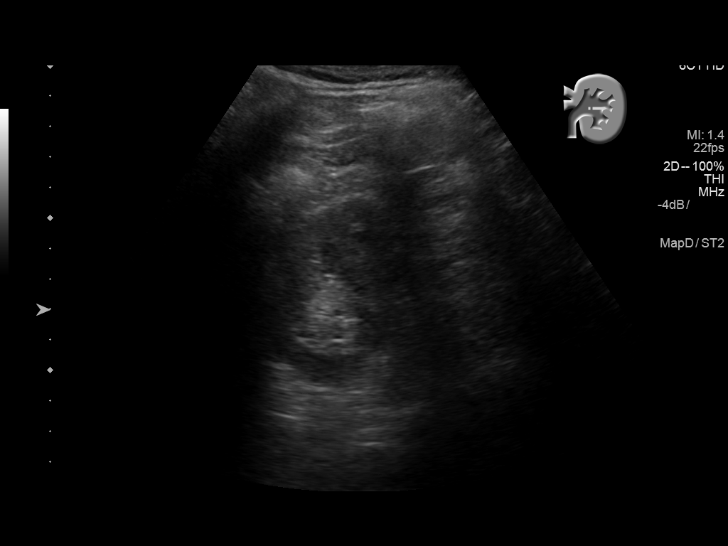
[im 94/94]
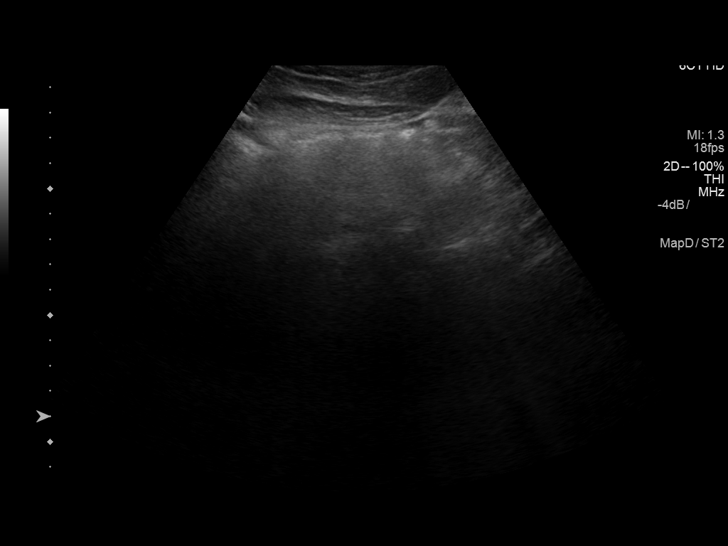

[14 of 25 positions shown; findings below may reference images not displayed]

FINDINGS: Gallbladder: No gallstones or wall thickening visualized. No
sonographic Murphy sign noted by sonographer.

Common bile duct: Diameter: 3 mm, normal

Liver: Liver echogenicity is within normal limits. There is a 15 mm
round hyperechoic area in the right lobe (image 43) compatible with
benign hemangioma. No other discrete liver lesion. No intrahepatic
biliary ductal dilatation.

IVC: No abnormality visualized.

Pancreas: Visualized portion unremarkable.

Spleen: Size and appearance within normal limits.

Right Kidney: Length: 10.6 cm. Echogenicity within normal limits. No
mass or hydronephrosis visualized.

Left Kidney: Length: 10.4 cm. Echogenicity within normal limits. No
mass or hydronephrosis visualized.

Abdominal aorta: Incompletely visualized due to overlying bowel gas,
visualized portions within normal limits.

Other findings: None.
IMPRESSION: Negative abdomen ultrasound.

Normal gallbladder and no evidence of biliary obstruction.
Incidental small benign hemangioma in the liver.
